# Patient Record
Sex: Male | Born: 1980
Health system: Southern US, Community
[De-identification: ages and names within clinical notes are randomized; demographics above are authoritative.]

## PROBLEM LIST (undated history)

## (undated) DIAGNOSIS — L309 Dermatitis, unspecified: Secondary | ICD-10-CM

## (undated) DIAGNOSIS — M419 Scoliosis, unspecified: Secondary | ICD-10-CM

## (undated) DIAGNOSIS — T7840XA Allergy, unspecified, initial encounter: Secondary | ICD-10-CM

## (undated) DIAGNOSIS — J309 Allergic rhinitis, unspecified: Secondary | ICD-10-CM

## (undated) DIAGNOSIS — K219 Gastro-esophageal reflux disease without esophagitis: Secondary | ICD-10-CM

## (undated) DIAGNOSIS — B079 Viral wart, unspecified: Secondary | ICD-10-CM

## (undated) HISTORY — PX: LASIK: SHX215

## (undated) HISTORY — PX: TYMPANOSTOMY TUBE PLACEMENT: SHX32

## (undated) HISTORY — DX: Gastro-esophageal reflux disease without esophagitis: K21.9

## (undated) HISTORY — DX: Viral wart, unspecified: B07.9

## (undated) HISTORY — DX: Allergy, unspecified, initial encounter: T78.40XA

## (undated) HISTORY — DX: Allergic rhinitis, unspecified: J30.9

## (undated) HISTORY — DX: Dermatitis, unspecified: L30.9

## (undated) HISTORY — DX: Scoliosis, unspecified: M41.9

---

## 2012-12-29 ENCOUNTER — Ambulatory Visit (INDEPENDENT_AMBULATORY_CARE_PROVIDER_SITE_OTHER): Payer: BC Managed Care – PPO | Admitting: Physician Assistant

## 2012-12-29 VITALS — BP 108/68 | HR 92 | Temp 97.9°F | Resp 18 | Ht 71.0 in | Wt 166.8 lb

## 2012-12-29 DIAGNOSIS — M5380 Other specified dorsopathies, site unspecified: Secondary | ICD-10-CM

## 2012-12-29 DIAGNOSIS — S335XXA Sprain of ligaments of lumbar spine, initial encounter: Secondary | ICD-10-CM

## 2012-12-29 DIAGNOSIS — S39012A Strain of muscle, fascia and tendon of lower back, initial encounter: Secondary | ICD-10-CM

## 2012-12-29 DIAGNOSIS — M62830 Muscle spasm of back: Secondary | ICD-10-CM

## 2012-12-29 MED ORDER — MELOXICAM 7.5 MG PO TABS: 15.0000 mg | ORAL_TABLET | Freq: Every day | ORAL | Status: DC

## 2012-12-29 MED ORDER — CYCLOBENZAPRINE HCL 5 MG PO TABS: 5.0000 mg | ORAL_TABLET | Freq: Three times a day (TID) | ORAL | Status: DC | PRN

## 2012-12-29 NOTE — Progress Notes (Signed)
   [address] [place] [address]   Phone [phone]  Subjective:    Patient ID: Jeffrey King, male    DOB: 1981-04-19, 32 y.o.   MRN: [id number]  HPI Pt presents to clinic with lumbar pain that started yesterday - he thinks while he was playing volleyball though he does not remember a particular incident where the injury occurred.  He only has local pain that is aggravated by movement.  He does not have pain radiation.  He has been using heat pacds but they have not been helping much. 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  He is having no bowel or bladder issues.     Review of Systems  Musculoskeletal: Positive for back pain. Negative for myalgias, arthralgias and gait problem.       Objective:   Physical Exam  Vitals reviewed. Constitutional: He appears well-developed and well-nourished.  HENT:  Head: Normocephalic and atraumatic.  Right Ear: External ear normal.  Left Ear: External ear normal.  Eyes: Conjunctivae are normal.  Neck: Normal range of motion.  Pulmonary/Chest: Effort normal.  Musculoskeletal:       Lumbar back: He exhibits decreased range of motion (patient moving stiffly - sitting on the table stiffly), tenderness and spasm (L spasm palpable). He exhibits no bony tenderness.  Skin: Skin is warm and dry.  Psychiatric: He has a normal mood and affect. His behavior is normal. Judgment and thought content normal.          Assessment & Plan:  Muscle spasm of back - ok to continue heat - stretches- Plan: meloxicam (MOBIC) 7.5 MG tablet, cyclobenzaprine (FLEXERIL) 5 MG tablet  Lumbar strain, initial encounter - Plan: meloxicam (MOBIC) 7.5 MG tablet, cyclobenzaprine (FLEXERIL) 5 MG tablet  Recheck in 7-10 d if not improving - sooner if worse  Benny Lennert PA-C 12/29/2012 3:55 PM

## 2013-01-31 ENCOUNTER — Ambulatory Visit (INDEPENDENT_AMBULATORY_CARE_PROVIDER_SITE_OTHER): Payer: BC Managed Care – PPO | Admitting: Family Medicine

## 2013-01-31 VITALS — BP 103/70 | HR 73 | Temp 98.0°F | Resp 16 | Ht 71.5 in | Wt 162.6 lb

## 2013-01-31 DIAGNOSIS — B084 Enteroviral vesicular stomatitis with exanthem: Secondary | ICD-10-CM

## 2013-01-31 DIAGNOSIS — R21 Rash and other nonspecific skin eruption: Secondary | ICD-10-CM

## 2013-01-31 DIAGNOSIS — M79609 Pain in unspecified limb: Secondary | ICD-10-CM

## 2013-01-31 LAB — POCT CBC
HCT, POC: 48.2 % (ref 43.5–53.7)
Hemoglobin: 15.2 g/dL (ref 14.1–18.1)
Lymph, poc: 1.3 (ref 0.6–3.4)
MCH, POC: 29.1 pg (ref 27–31.2)
MCHC: 31.5 g/dL — AB (ref 31.8–35.4)
WBC: 5.1 10*3/uL (ref 4.6–10.2)

## 2013-01-31 MED ORDER — HYDROCODONE-ACETAMINOPHEN 5-325 MG PO TABS: 1.0000 | ORAL_TABLET | Freq: Four times a day (QID) | ORAL | Status: DC | PRN

## 2013-01-31 MED ORDER — IBUPROFEN 800 MG PO TABS: 800.0000 mg | ORAL_TABLET | Freq: Three times a day (TID) | ORAL | Status: DC | PRN

## 2013-01-31 NOTE — Progress Notes (Signed)
[address], [place] [address]   Phone [phone]  Subjective:    Patient ID: Jeffrey King, male    DOB: October 28, 1980, 32 y.o.   MRN: [id number]  HPI Pt presents to clinic with painful soles and palms.  Started on Thursday with a slight sore throat and then a subjective fever.  Then yesterday he noticed a rash on his soles of his feet and palms of hands and then last pm they became painful to the point where he was unable to sleep last pm.  He drove here with his wrist because he could not grip the steering wheel.  He does not engage in high risk sex.  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  He has not travel recently.  He feels fine now except for the intense pain with the palms and soles of his feet and with a minor ST.  He has tried Motrin but not gotten any relief.    Review of Systems  Constitutional: Positive for fever (subjective). Negative for chills.  HENT: Positive for sore throat.   Musculoskeletal: Gait problem: 2nd to pain.  Skin: Positive for rash.       Objective:   Physical Exam  Vitals reviewed. Constitutional: He is oriented to person, place, and time. He appears well-developed and well-nourished.  HENT:  Head: Normocephalic and atraumatic.  Right Ear: Hearing, tympanic membrane, external ear and ear canal normal.  Left Ear: Hearing, tympanic membrane, external ear and ear canal normal.  Nose: Nose normal.  Mouth/Throat: Posterior oropharyngeal erythema present. No oropharyngeal exudate, posterior oropharyngeal edema or tonsillar abscesses.  Multiple blisters in his posterior pharynx and soft palate with a swollen and erythematous uvula.  No buccal or tongue lesions.  Pulmonary/Chest: Effort normal.  Neurological: He is alert and oriented to person, place, and time.  Skin: Skin is warm and dry. Rash noted. Rash is vesicular.  Vesicular rash with erythematous base on soles and palms and mouth consistent with hand foot mouth.  He has some  discrete erythematous papules on her arms and the R side of his back and posterior R thigh but these are not vesicles and they are not bothering him.  Psychiatric: He has a normal mood and affect. His behavior is normal. Judgment and thought content normal.   Results for orders placed in visit on 01/31/13  POCT CBC      Result Value Range   WBC 5.1  4.6 - 10.2 K/uL   Lymph, poc 1.3  0.6 - 3.4   POC LYMPH PERCENT 25.2  10 - 50 %L   MID (cbc) 0.4  0 - 0.9   POC MID % 7.5  0 - 12 %M   POC Granulocyte 3.4  2 - 6.9   Granulocyte percent 67.3  37 - 80 %G   RBC 5.23  4.69 - 6.13 M/uL   Hemoglobin 15.2  14.1 - 18.1 g/dL   HCT, POC 21.3  08.6 - 53.7 %   MCV 92.2  80 - 97 fL   MCH, POC 29.1  27 - 31.2 pg   MCHC 31.5 (*) 31.8 - 35.4 g/dL   RDW, POC 57.8     Platelet Count, POC 162  142 - 424 K/uL   MPV 8.6  0 - 99.8 fL          Assessment & Plan:  Rash and nonspecific skin eruption -The pain seems unusual for Hand Foot Mouth but the rash is consistent  with the infection as is the prodrome of fever and not feeling well.  Will treat the pain and wait for 3-5d for the disease to spontaneously resolve like expected with the natures course of the infection.  Will check other labs but expect them to be normal.  Plan: POCT CBC, RPR, HIV antibody  Hand, foot and mouth disease - Plan: ibuprofen (ADVIL,MOTRIN) 800 MG tablet, HYDROcodone-acetaminophen (NORCO/VICODIN) 5-325 MG per tablet  Hand and feet pain.  D/w Dr Enos Fling PA-C 01/31/2013 11:57 AM

## 2013-03-10 NOTE — Progress Notes (Signed)
History and physical exam reviewed in detail with Sarah Weber, PA-C. Agree with assessment and plan. 

## 2013-10-04 ENCOUNTER — Ambulatory Visit (INDEPENDENT_AMBULATORY_CARE_PROVIDER_SITE_OTHER): Payer: BC Managed Care – PPO | Admitting: Family Medicine

## 2013-10-04 VITALS — BP 105/75 | HR 73 | Temp 99.8°F | Resp 16 | Ht 71.5 in | Wt 157.0 lb

## 2013-10-04 DIAGNOSIS — R21 Rash and other nonspecific skin eruption: Secondary | ICD-10-CM

## 2013-10-04 DIAGNOSIS — H6980 Other specified disorders of Eustachian tube, unspecified ear: Secondary | ICD-10-CM

## 2013-10-04 LAB — POCT SKIN KOH: SKIN KOH, POC: NEGATIVE

## 2013-10-04 MED ORDER — FLUTICASONE PROPIONATE 50 MCG/ACT NA SUSP: 2.0000 | Freq: Every day | NASAL | Status: DC

## 2013-10-04 MED ORDER — TRIAMCINOLONE ACETONIDE 0.1 % EX CREA: 1.0000 "application " | TOPICAL_CREAM | Freq: Two times a day (BID) | CUTANEOUS | Status: DC

## 2013-10-04 NOTE — Patient Instructions (Signed)
I will give you a call in a little while with your skin scraping results.  Try the nasal spray once a day for about a month- if not helpful please let me know!

## 2013-10-04 NOTE — Progress Notes (Signed)
[facility] [address], [place] [id number] [phone]  Date:  10/04/2013   Name:  Jeffrey HippsDarren King   DOB:  02/19/1981   MRN:  [id number]  PCP:  No primary provider on file.    Chief Complaint: Rash   History of Present Illness:  Jeffrey King is a 33 y.o. very pleasant male patient who presents with the following:  He is here today with a rash on his neck for 4 or 5 weeks- it is not really itchy, but feels "like mild burning."  Seemed to start after a haircut.  He does not have any other rash.  The haircut was just his typical haircut- he had sx the next day.  He has tried vaseline on it so far.   He has never had this in the past.  Over the last month the rash has not really changed.    He also noted a problem with his right ear- his ear will feel congested.  This comes and goes, he has noted it since he was a child.  Only his right ear is effected.  The ear will sometimes feel sensitive.  He may notice this a couple of times a week.  So far he has tried hydrogen peroxide drops.  He sometimes has some wax.  He has never tried any nasal spray. He is not sure if he may have some allergies- he does have sneezing and runny nose a good amount of the time.     There are no active problems to display for this patient.   Past Medical History  Diagnosis Date  . Allergy     No past surgical history on file.  History  Substance Use Topics  . Smoking status: Never Smoker   . Smokeless tobacco: Not on file  . Alcohol Use: Yes     Comment: rare socailly    Family History  Problem Relation Age of Onset  . Hypertension Father     No Known Allergies  Medication list has been reviewed and updated.  Current Outpatient Prescriptions on File Prior to Visit  Medication Sig Dispense Refill  . ibuprofen (ADVIL,MOTRIN) 800 MG tablet Take 1 tablet (800 mg total) by mouth every 8 (eight) hours as needed for pain.  30 tablet  0   No current facility-administered  medications on file prior to visit.    Review of Systems:  As per HPI- otherwise negative.   Physical Examination: Filed Vitals:   10/04/13 1151  BP: 105/75  Pulse: 73  Temp: 99.8 F (37.7 C)  Resp: 16   Filed Vitals:   10/04/13 1151  Height: 5' 11.5" (1.816 m)  Weight: 157 lb (71.215 kg)   Body mass index is 21.59 kg/(m^2). Ideal Body Weight: Weight in (lb) to have BMI = 25: 181.4  GEN: WDWN, NAD, Non-toxic, A & O x 3, looks well HEENT: Atraumatic, Normocephalic. Neck supple. No masses, No LAD.  Bilateral TM wnl, oropharynx normal.  PEERL,EOMI.   Nasal cavity congested Ears and Nose: No external deformity. CV: RRR, No M/G/R. No JVD. No thrill. No extra heart sounds. PULM: CTA B, no wheezes, crackles, rhonchi. No retractions. No resp. distress. No accessory muscle use. EXTR: No c/c/e NEURO Normal gait.  PSYCH: Normally interactive. Conversant. Not depressed or anxious appearing.  Calm demeanor.  He has a scaly, patchy rash on the right posterior neck.  No other rash, no lesions or vesicles  Results for orders placed in visit on  10/04/13  POCT SKIN KOH      Result Value Ref Range   Skin KOH, POC Negative      Assessment and Plan: Rash and nonspecific skin eruption - Plan: POCT Skin KOH, triamcinolone cream (KENALOG) 0.1 %  ETD (eustachian tube dysfunction) - Plan: fluticasone (FLONASE) 50 MCG/ACT nasal spray  Will try triamcinolone for likely eczema type rash; he can use BID, if not better in 7- 10 days he is to give me a call Trial of flonase for periodic ETD of the right ear; if not helpful will refer t ENT  Signed Abbe Amsterdam, MD

## 2014-01-20 ENCOUNTER — Ambulatory Visit (INDEPENDENT_AMBULATORY_CARE_PROVIDER_SITE_OTHER): Payer: BC Managed Care – PPO | Admitting: Family Medicine

## 2014-01-20 VITALS — BP 108/82 | HR 85 | Temp 98.3°F | Resp 16 | Ht 71.5 in | Wt 162.4 lb

## 2014-01-20 DIAGNOSIS — M62838 Other muscle spasm: Secondary | ICD-10-CM

## 2014-01-20 MED ORDER — CYCLOBENZAPRINE HCL 10 MG PO TABS: 10.0000 mg | ORAL_TABLET | Freq: Three times a day (TID) | ORAL | Status: DC | PRN

## 2014-01-20 MED ORDER — FLUTICASONE PROPIONATE 50 MCG/ACT NA SUSP: 2.0000 | Freq: Every day | NASAL | Status: DC

## 2014-01-20 MED ORDER — TRAMADOL HCL 50 MG PO TABS: 50.0000 mg | ORAL_TABLET | Freq: Three times a day (TID) | ORAL | Status: DC | PRN

## 2014-01-20 NOTE — Patient Instructions (Signed)
Thank you for coming in today  You have neck spasms. 1. Take cyclobenzaprine for spasm up to 3x per day. Will make you drowsy. Do not drive on it. 2. Take ibuprofen or aleve as needed for pain 3. Use tramadol as needed for pain 4. Apply heating pack to neck for 15 min every 3 hrs and work on neck motion 5. Try to stretch neck and work on motion 6. Out of work until Monday 7. If not much better on Monday, please return 8. Return sooner if any worsening pain, numbness, weakness  Torticollis, Acute You have suddenly (acutely) developed a twisted neck (torticollis). This is usually a self-limited condition. CAUSES  Acute torticollis may be caused by malposition, trauma or infection. Most commonly, acute torticollis is caused by sleeping in an awkward position. Torticollis may also be caused by the flexion, extension or twisting of the neck muscles beyond their normal position. Sometimes, the exact cause may not be known. SYMPTOMS  Usually, there is pain and limited movement of the neck. Your neck may twist to one side. DIAGNOSIS  The diagnosis is often made by physical examination. X-rays, CT scans or MRIs may be done if there is a history of trauma or concern of infection. TREATMENT  For a common, stiff neck that develops during sleep, treatment is focused on relaxing the contracted neck muscle. Medications (including shots) may be used to treat the problem. Most cases resolve in several days. Torticollis usually responds to conservative physical therapy. If left untreated, the shortened and spastic neck muscle can cause deformities in the face and neck. Rarely, surgery is required. HOME CARE INSTRUCTIONS   Use over-the-counter and prescription medications as directed by your caregiver.  Do stretching exercises and massage the neck as directed by your caregiver.  Follow up with physical therapy if needed and as directed by your caregiver. SEEK IMMEDIATE MEDICAL CARE IF:   You develop  difficulty breathing or noisy breathing (stridor).  You drool, develop trouble swallowing or have pain with swallowing.  You develop numbness or weakness in the hands or feet.  You have changes in speech or vision.  You have problems with urination or bowel movements.  You have difficulty walking.  You have a fever.  You have increased pain. MAKE SURE YOU:   Understand these instructions.  Will watch your condition.  Will get help right away if you are not doing well or get worse. Document Released: 07/19/2000 Document Revised: 10/14/2011 Document Reviewed: 08/30/2009 [facility] Patient Information 2015 [employer] [place]. This information is not intended to replace advice given to you by your health care provider. Make sure you discuss any questions you have with your health care provider.

## 2014-01-20 NOTE — Progress Notes (Signed)
   Subjective:    Patient ID: Jeffrey King, male    DOB: 08/07/1980, 33 y.o.   MRN: [id number]  HPI Patient presents with neck and upper back pain. Patient thinks he pulled something when he was putting pants on this morning. Pain radiates from neck all the down his back. Cannot move neck, very stiff. Pain to move neck. Pain even laying in bed. Tried icy hot patch and ibuprofen. Has not improved since this morning. No trauma. No pain radiating down arms. Has some numbness at the base of his neck but no numbness in hands. Had a crick in neck last year that was similar but less severe that resolved in 2 weeks. Has history of minor scoliosis. No fever. Not feeling ill.  Review of Systems  All other systems reviewed and are negative.      Objective:   Physical Exam  Constitutional: He is oriented to person, place, and time. He appears well-developed and well-nourished. No distress.  HENT:  Head: Normocephalic and atraumatic.  Eyes: Conjunctivae are normal. No scleral icterus.  Cardiovascular: Normal rate.   Pulmonary/Chest: Effort normal.  Neurological: He is alert and oriented to person, place, and time.  Skin: Skin is warm and dry.  Psychiatric: He has a normal mood and affect. His behavior is normal.  Neck: No midline TTP. ROM very decreased. Flexion to 5 fingers chin to chest. Extension to 15 deg, rotation to 30 deg each direction. TTP and palpable spasm over trap, rhomboids.    Assessment & Plan:  #1. Neck spasm - Flexeril, NSAIDs, tramadol - Heat - Work on ROM - No trauma so will defer xray at this time - Low suspicion for infection or illness given no systemic sx - Return Monday if not returning and consider xrays and PT vs. Other etiologies of pain. - Return precautions for worsening neurologic symptoms.

## 2014-01-24 ENCOUNTER — Ambulatory Visit: Payer: BC Managed Care – PPO | Admitting: Family Medicine

## 2014-01-31 ENCOUNTER — Ambulatory Visit (INDEPENDENT_AMBULATORY_CARE_PROVIDER_SITE_OTHER): Payer: BC Managed Care – PPO | Admitting: Physician Assistant

## 2014-01-31 VITALS — BP 118/62 | HR 73 | Temp 97.6°F | Resp 16 | Ht 71.0 in | Wt 159.0 lb

## 2014-01-31 DIAGNOSIS — T63481A Toxic effect of venom of other arthropod, accidental (unintentional), initial encounter: Secondary | ICD-10-CM

## 2014-01-31 DIAGNOSIS — J309 Allergic rhinitis, unspecified: Secondary | ICD-10-CM | POA: Insufficient documentation

## 2014-01-31 DIAGNOSIS — T6391XA Toxic effect of contact with unspecified venomous animal, accidental (unintentional), initial encounter: Secondary | ICD-10-CM

## 2014-01-31 DIAGNOSIS — J301 Allergic rhinitis due to pollen: Secondary | ICD-10-CM

## 2014-01-31 MED ORDER — PREDNISONE 20 MG PO TABS: ORAL_TABLET | ORAL | Status: DC

## 2014-01-31 NOTE — Patient Instructions (Signed)
Continue the Flonase. The prednisone will help that temporarily, too. Once you complete the prednisone, consider adding a daily dose of Allegra, Claritin or Zyrtec if your allergy symptoms are not adequately controlled by the Flonase alone.

## 2014-01-31 NOTE — Progress Notes (Signed)
   Subjective:    Patient ID: Jeffrey King, male    DOB: 12/17/1980, 33 y.o.   MRN: [id number]   PCP: No PCP Per Patient  Chief Complaint  Patient presents with  . Insect Bite    stung yesterday, right index finger is swollen and stiff, painful    Medications, allergies, past medical history, surgical history, family history, social history and problem list reviewed and updated.  Patient Active Problem List   Diagnosis Date Noted  . Allergic rhinitis 01/31/2014    Prior to Admission medications   Medication Sig Start Date End Date Taking? Authorizing Provider  fluticasone (FLONASE) 50 MCG/ACT nasal spray Place 2 sprays into both nostrils daily. 01/20/14  Yes Daine GipErin L Voss, MD  triamcinolone cream (KENALOG) 0.1 % Apply 1 application topically 2 (two) times daily. 10/04/13   Pearline CablesJessica C Copland, MD    HPI  Presents for evaluation of swelling of the RIGHT index finger after sting by a yellow jacket yesterday afternoon while mowing the yard.  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 He took a benadryl yesterday which "knocked me out." He notes the swelling is worse this morning, and he has pain with bending the index finger.  No other lesions.  No mouth, tongue or throat symptoms. No pulmonary symptoms.  Review of Systems As above.    Objective:   Physical Exam  Constitutional: He is oriented to person, place, and time. He appears well-developed and well-nourished. No distress.  BP 118/62  Pulse 73  Temp(Src) 97.6 F (36.4 C) (Oral)  Resp 16  Ht 5\' 11"  (1.803 m)  Wt 159 lb (72.122 kg)  BMI 22.19 kg/m2  SpO2 99%   Eyes: Conjunctivae are normal. No scleral icterus.  Neck: Neck supple. No thyromegaly present.  Cardiovascular: Normal rate.   Pulmonary/Chest: Effort normal.  Lymphadenopathy:    He has no cervical adenopathy.  Neurological: He is alert and oriented to person, place, and time.  Skin: Skin is warm, dry and intact.  No abrasion, no burn, no lesion and no rash noted. There is erythema.  Erythema of the index finger, primarily of the palmar surface. Stable joints.  ROM reduced by pain and swelling.  Psychiatric: He has a normal mood and affect. His behavior is normal. Thought content normal.          Assessment & Plan:  1. Insect sting, accidental or unintentional, initial encounter He has triamcinolone cream at home from a previous visit. OTC antihistamine prn. Anticipatory guidance provided. - predniSONE (DELTASONE) 20 MG tablet; Take 3 PO QAM x3days, 2 PO QAM x3days, 1 PO QAM x3days  Dispense: 18 tablet; Refill: 0  2. Allergic rhinitis due to pollen Encouraged to continue Flonase.  The prednisone above will also help the ETD symptoms he has.  If symptoms persist, RTC to discuss additional therapy.   Fernande Brashelle S. Jeffery, PA-C Physician Assistant-Certified [facility]

## 2014-03-16 ENCOUNTER — Encounter: Payer: BC Managed Care – PPO | Admitting: Family Medicine

## 2014-03-23 ENCOUNTER — Other Ambulatory Visit: Payer: Self-pay | Admitting: Internal Medicine

## 2014-03-23 DIAGNOSIS — J329 Chronic sinusitis, unspecified: Secondary | ICD-10-CM

## 2014-03-23 DIAGNOSIS — M419 Scoliosis, unspecified: Secondary | ICD-10-CM

## 2014-03-29 ENCOUNTER — Ambulatory Visit
Admission: RE | Admit: 2014-03-29 | Discharge: 2014-03-29 | Disposition: A | Discharge status: Home / Self Care | Payer: BC Managed Care – PPO | Source: Ambulatory Visit | Attending: Internal Medicine | Admitting: Internal Medicine

## 2014-03-29 DIAGNOSIS — M419 Scoliosis, unspecified: Secondary | ICD-10-CM

## 2014-03-29 DIAGNOSIS — J329 Chronic sinusitis, unspecified: Secondary | ICD-10-CM

## 2015-01-20 IMAGING — CT CT MAXILLOFACIAL W/O CM
2 of 3 series · 15 of 37 positions shown, 18 images · non-contrast
Comparison: None.

CLINICAL DATA: RIGHT ear pain  and eye pain.  Headaches.

EXAM:
CT MAXILLOFACIAL WITHOUT CONTRAST
TECHNIQUE: Multidetector CT imaging of the maxillofacial structures was
performed. Multiplanar CT image reconstructions were also generated.
A small metallic BB was placed on the right temple in order to
reliably differentiate right from left.

[Series 601: coronal facial · coronal · 0.37mm/px · 3 of 82 slices shown]
[im 28/82  bone]
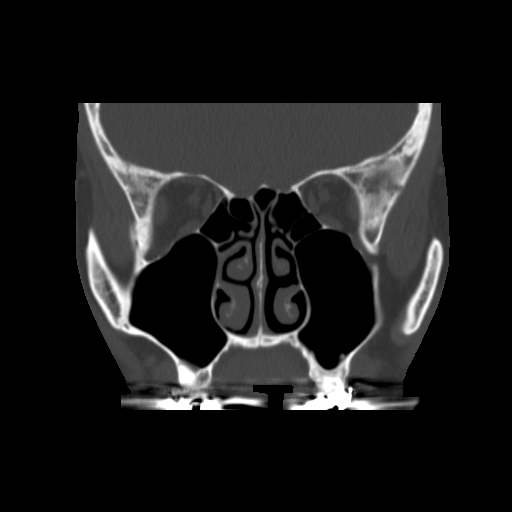
[im 41/82  bone]
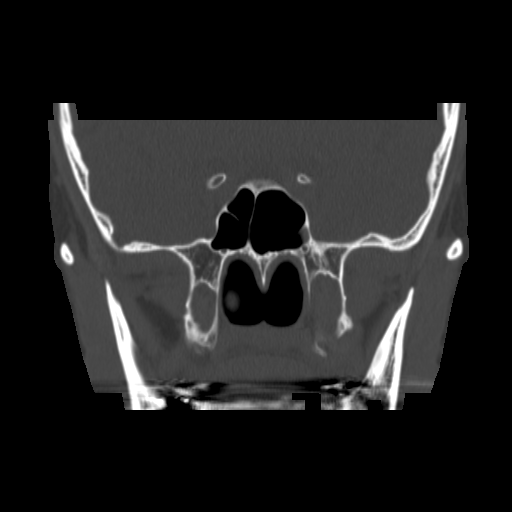
[im 55/82  bone]
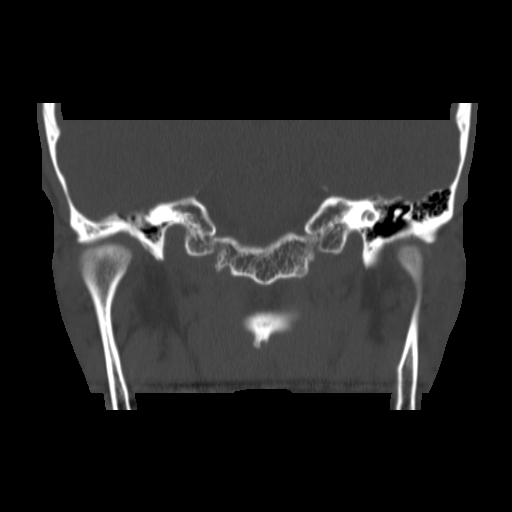

[Series 602: sagittal facial · sagittal · 0.37mm/px · 12 of 95 slices shown, 15 images]
[im 10/95  brain]
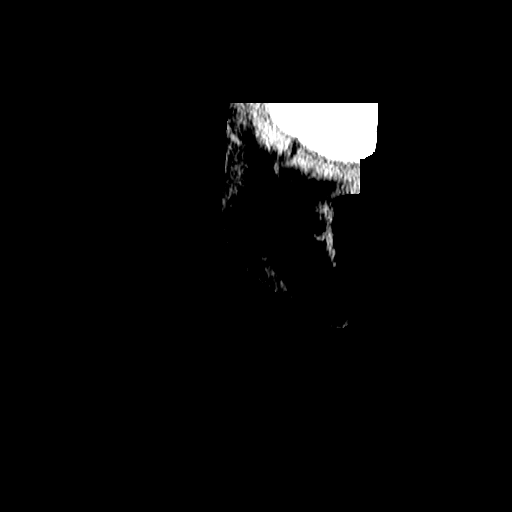
[im 10/95  bone]
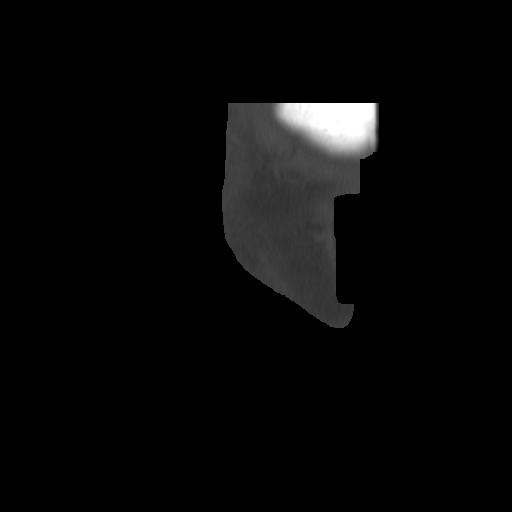
[im 20/95  bone]
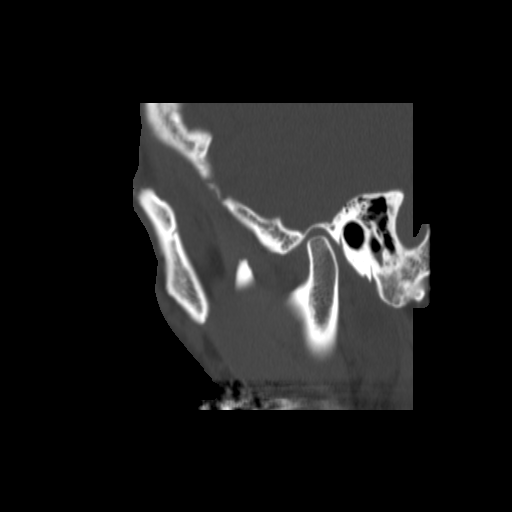
[im 25/95  bone]
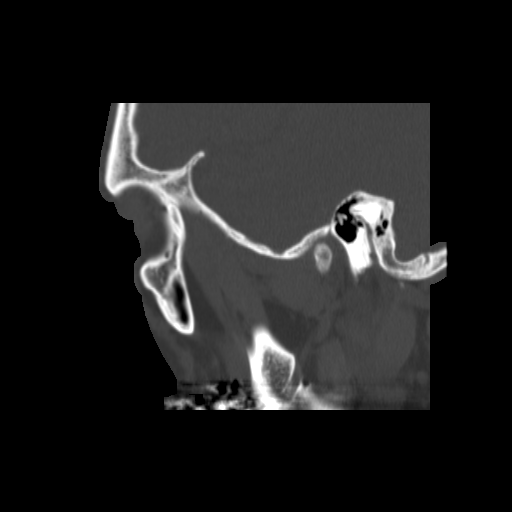
[im 30/95  bone]
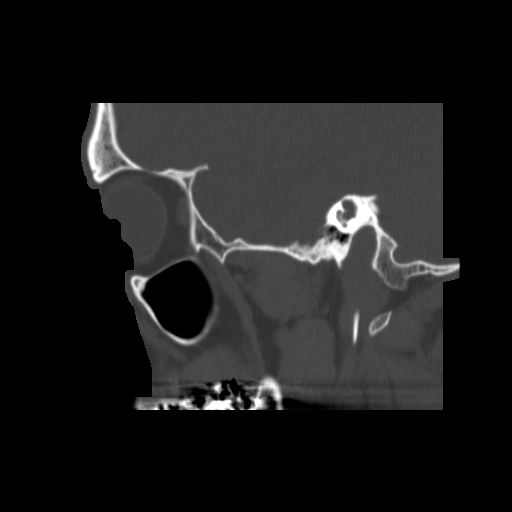
[im 40/95  brain]
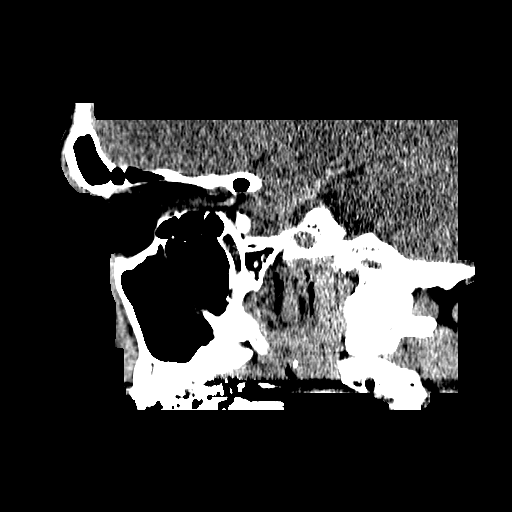
[im 40/95  bone]
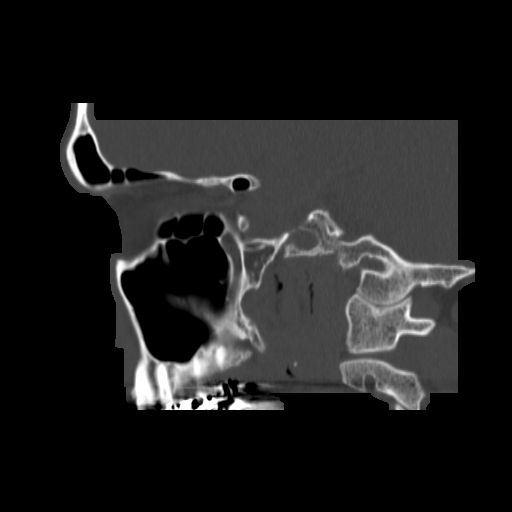
[im 45/95  bone]
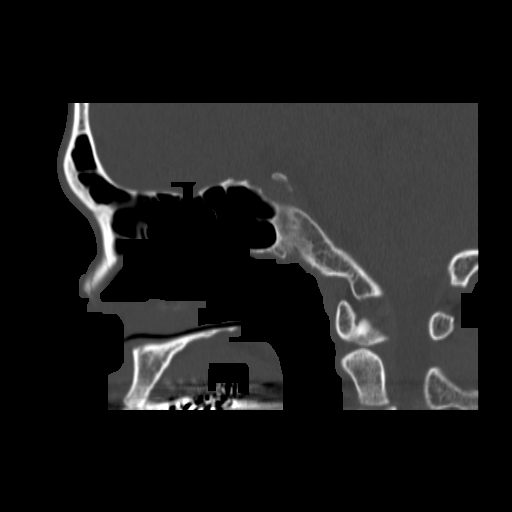
[im 55/95  bone]
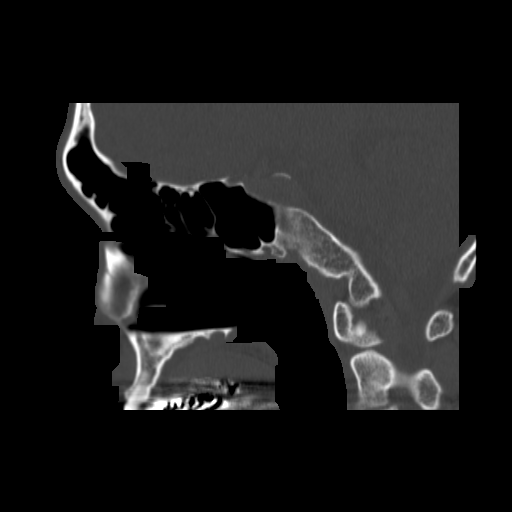
[im 60/95  bone]
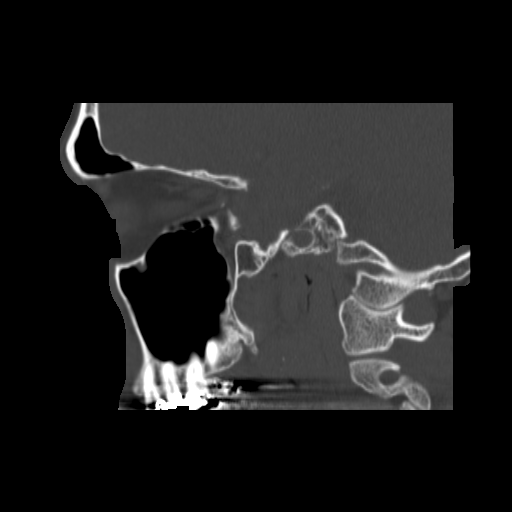
[im 70/95  brain]
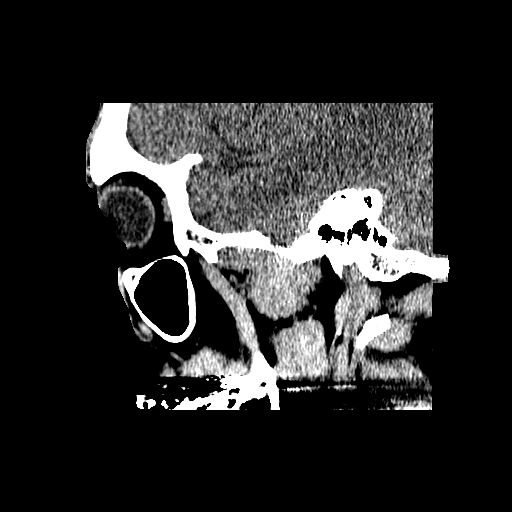
[im 70/95  bone]
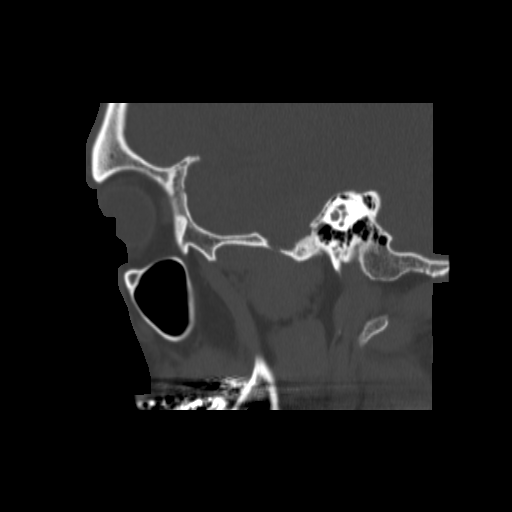
[im 75/95  bone]
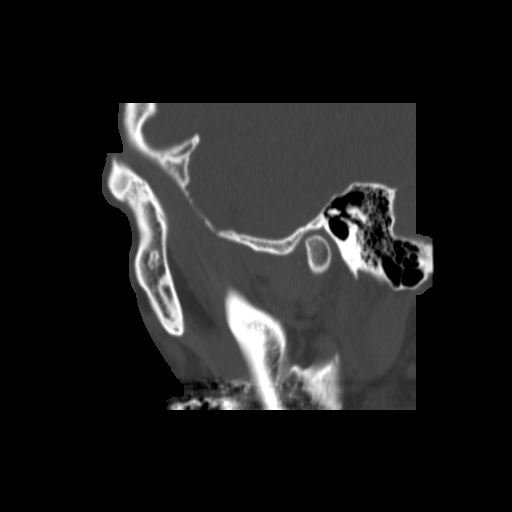
[im 80/95  bone]
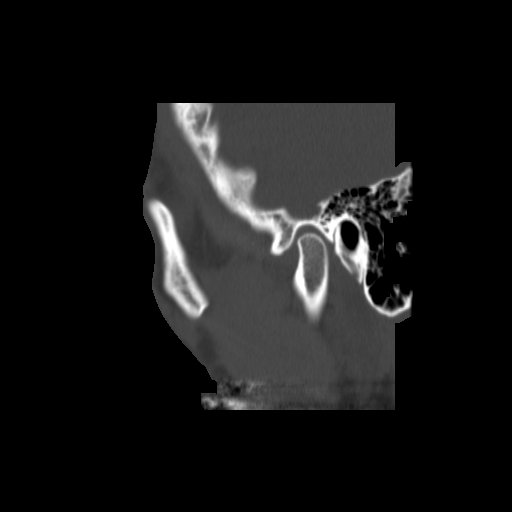
[im 90/95  bone]
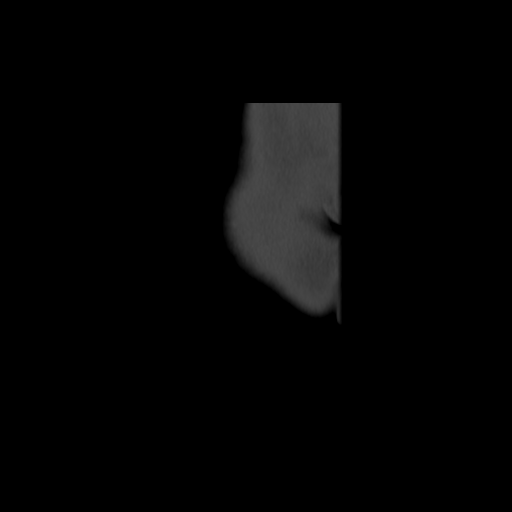

[15 of 37 positions shown; findings below may reference images not displayed]

FINDINGS: The temporal bones are not evaluated as part of the CT maxillofacial
study. If there is concern for temporal bone pathology, CT temporal
bone is needed.

The maxillary sinuses are clear. The ethmoid sinuses are clear. The
sphenoid sinus is clear. The frontal sinuses are clear. There is no
orbital inflammation or mass. Negative intracranial compartment.
Slight nasal septal deviation RIGHT to LEFT of 1-2 mm. TMJs are
located. There is no facial bone deformity or fracture. Visualized
intracranial compartment unremarkable.

In those portions of the temporal bone which are visualized, there
is no middle ear or mastoid fluid observed. External canals appear
patent. No superficial cellulitic change is observed.
IMPRESSION: Unremarkable CT maxillofacial.

## 2016-01-19 ENCOUNTER — Encounter: Payer: Self-pay | Admitting: Family Medicine

## 2016-01-19 ENCOUNTER — Ambulatory Visit (INDEPENDENT_AMBULATORY_CARE_PROVIDER_SITE_OTHER): Payer: BLUE CROSS/BLUE SHIELD | Admitting: Family Medicine

## 2016-01-19 VITALS — BP 102/62 | HR 73 | Temp 98.1°F | Resp 17 | Ht 71.5 in | Wt 163.0 lb

## 2016-01-19 DIAGNOSIS — R21 Rash and other nonspecific skin eruption: Secondary | ICD-10-CM

## 2016-01-19 DIAGNOSIS — L282 Other prurigo: Secondary | ICD-10-CM | POA: Diagnosis not present

## 2016-01-19 MED ORDER — PERMETHRIN 5 % EX CREA: 1.0000 "application " | TOPICAL_CREAM | Freq: Once | CUTANEOUS | Status: DC

## 2016-01-19 MED ORDER — TRIAMCINOLONE ACETONIDE 0.1 % EX CREA: 1.0000 "application " | TOPICAL_CREAM | Freq: Two times a day (BID) | CUTANEOUS | Status: DC

## 2016-01-19 NOTE — Progress Notes (Signed)
By signing my name below I, Shelah Lewandowsky, attest that this documentation has been prepared under the direction and in the presence of Shade Flood, MD. Electonically Signed. Shelah Lewandowsky, Scribe 01/19/2016 at 11:24 AM  Subjective:    Patient ID: Jeffrey King, male    DOB: [birth year], 35 y.o.   MRN: [id number]  Chief Complaint  Patient presents with  . Rash    on legs     HPI Jeffrey King is a 35 y.o. male who presents to the [facility] complaining of bilat leg rash for the past 2 weeks. Rash has been spreading steadily down legs over the past 2 weeks and to arms 2 days ago. Rash is mostly pruritic. Pt has been trying anti-itch cream and hydrocortisone cream with minimal relief.  Pt has been waking up itching the rash. Pt denies fever. Pt denies any known contacts with similar symptoms. Pt does not live with pets. Pt has been crawling in the crawlspace under his house for past 4 weeks. Pt denies rash on genitals, or in mouth. Pt denies difficulty breathing. Pt denies any new supplements, medications, or foods.   Pt's 2 children and wife who live with pt do not have any similar symptoms.     Patient Active Problem List   Diagnosis Date Noted  . Allergic rhinitis 01/31/2014   Past Medical History  Diagnosis Date  . Allergy   . Scoliosis     mild   No past surgical history on file. No Known Allergies Prior to Admission medications   Medication Sig Start Date End Date Taking? Authorizing Provider  fluticasone (FLONASE) 50 MCG/ACT nasal spray Place 2 sprays into both nostrils daily. 01/20/14  Yes Daine Gip, MD  predniSONE (DELTASONE) 20 MG tablet Take 3 PO QAM x3days, 2 PO QAM x3days, 1 PO QAM x3days Patient not taking: Reported on 01/19/2016 01/31/14   Porfirio Oar, PA-C  triamcinolone cream (KENALOG) 0.1 % Apply 1 application topically 2 (two) times daily. Patient not taking: Reported on 01/19/2016 10/04/13   Pearline Cables, MD   Social History   Social  History  . Marital Status: Single    Spouse Name: n/a  . Number of Children: 3  . Years of Education: 14   Occupational History  . nail tech     [employer]   Social History Main Topics  . Smoking status: Former Games developer  . Smokeless tobacco: Never Used  . Alcohol Use: Yes     Comment: rare socially  . Drug Use: No  . Sexual Activity: Not on file   Other Topics Concern  . Not on file   Social History Narrative   Lives with his 3 children. His family lives in [place].      Review of Systems  Constitutional: Negative for fever.  Skin: Positive for rash (bilat legs).  Psychiatric/Behavioral: Positive for sleep disturbance.       Objective:   Physical Exam  Constitutional: He is oriented to person, place, and time. He appears well-developed and well-nourished. No distress.  HENT:  Head: Normocephalic and atraumatic.  Mouth/Throat: Oropharynx is clear and moist and mucous membranes are normal. No oral lesions. No oropharyngeal exudate or posterior oropharyngeal erythema.  Eyes: Conjunctivae are normal. Pupils are equal, round, and reactive to light.  Neck: Neck supple.  Cardiovascular: Normal rate, regular rhythm and normal heart sounds.  Exam reveals no gallop and no friction rub.   No murmur heard. Pulmonary/Chest: Effort normal  and breath sounds normal. No accessory muscle usage. He has no decreased breath sounds. He has no wheezes. He has no rales.  Musculoskeletal: Normal range of motion.  Neurological: He is alert and oriented to person, place, and time.  Skin: Skin is warm and dry.  Pt has multiple scattered papules and excoriated areas on the lower extremities L>R. Minimal erythema. There are a few new papules on the dorsum left foot and rt foot (L>R) and left ankle and the posterior calf and knee. Pt also has one linear rash on left upper arm, few small scattered papules on left wrist. No interdigital lesions.  There are also 2 small papules on rt arm.    Psychiatric: He has a normal mood and affect. His behavior is normal.  Nursing note and vitals reviewed.    Filed Vitals:   01/19/16 1055  BP: 102/62  Pulse: 73  Temp: 98.1 F (36.7 C)  TempSrc: Oral  Resp: 17  Height: 5' 11.5" (1.816 m)  Weight: 163 lb (73.936 kg)  SpO2: 100%        Assessment & Plan:   Jeffrey King is a 35 y.o. male Rash and nonspecific skin eruption - Plan: permethrin (ELIMITE) 5 % cream, triamcinolone cream (KENALOG) 0.1 %  Pruritic rash - Plan: permethrin (ELIMITE) 5 % cream, triamcinolone cream (KENALOG) 0.1 %  Persistent rash for 2 weeks, initially on lower extremities, but now on upper extremities/arms, one area of possible burrow on left arm. Spares genitalia and mouth.  Differential includes scabies, versus other insect bite versus contact dermatitis.  - We'll try Elimite cream tonight, then triamcinolone topical if needed for residual itching areas. If it continues to spread, return for recheck, or if not improving after 1 week, return for recheck. RTC precautions.  Meds ordered this encounter  Medications  . permethrin (ELIMITE) 5 % cream    Sig: Apply 1 application topically once.    Dispense:  60 g    Refill:  0  . triamcinolone cream (KENALOG) 0.1 %    Sig: Apply 1 application topically 2 (two) times daily.    Dispense:  30 g    Refill:  0   Patient Instructions       IF you received an x-ray today, you will receive an invoice from [facility]. Please contact [facility] at [phone] with questions or concerns regarding your invoice.   IF you received labwork today, you will receive an invoice from [employer]. Please contact [facility] at [phone] with questions or concerns regarding your invoice.   Our billing staff will not be able to assist you with questions regarding bills from these companies.  You will be contacted with the lab results as soon as they are available. The  fastest way to get your results is to activate your My Chart account. Instructions are located on the last page of this paperwork. If you have not heard from [place] regarding the results in 2 weeks, please contact this office.     Your rash including area spreading to arms is suspicious for possible scabies. It can also be due to other insect bites, or a contact dermatitis. Try the Elimite cream once tonight. Rinses after 8 hours, but as we discussed make sure you wash bed clothes in hot water prior to showering off in the morning. If you still have some areas of itching, you can use the steroid cream that I prescribed. If symptoms are not improving into next Friday, or  worsening sooner, return for recheck.  Scabies, Adult Scabies is a skin condition that happens when very small insects get under the skin (infestation). This causes a rash and severe itchiness. Scabies can spread from person to person (is contagious). If you get scabies, it is common for others in your household to get scabies too. With proper treatment, symptoms usually go away in 2-4 weeks. Scabies usually does not cause lasting problems. CAUSES This condition is caused by mites (Sarcoptes scabiei, or human itch mites) that can only be seen with a microscope. The mites get into the top layer of skin and lay eggs. Scabies can spread from person to person through:  Close contact with a person who has scabies.  Contact with infested items, such as towels, bedding, or clothing. RISK FACTORS This condition is more likely to develop in:  People who live in nursing homes and other extended-care facilities.  People who have sexual contact with a partner who has scabies.  Young children who attend child care facilities.  People who care for others who are at increased risk for scabies. SYMPTOMS Symptoms of this condition may include:  Severe itchiness. This is often worse at night.  A rash that includes tiny red bumps or blisters.  The rash commonly occurs on the wrist, elbow, armpit, fingers, waist, groin, or buttocks. Bumps may form a line (burrow) in some areas.  Skin irritation. This can include scaly patches or sores. DIAGNOSIS This condition is diagnosed with a physical exam. Your health care provider will look closely at your skin. In some cases, your health care provider may take a sample of your affected skin (skin scraping) and have it examined under a microscope. TREATMENT This condition may be treated with:  Medicated cream or lotion that kills the mites. This is spread on the entire body and left on for several hours. Usually, one treatment with medicated cream or lotion is enough to kill all of the mites. In severe cases, the treatment may be repeated.  Medicated cream that relieves itching.  Medicines that help to relieve itching.  Medicines that kill the mites. This treatment is rarely used. HOME CARE INSTRUCTIONS Medicines  Take or apply over-the-counter and prescription medicines as told by your health care provider.  Apply medicated cream or lotion as told by your health care provider.  Do not wash off the medicated cream or lotion until the necessary amount of time has passed. Skin Care  Avoid scratching your affected skin.  Keep your fingernails closely trimmed to reduce injury from scratching.  Take cool baths or apply cool washcloths to help reduce itching. General Instructions  Clean all items that you recently had contact with, including bedding, clothing, and furniture. Do this on the same day that your treatment starts.  Use hot water when you wash items.  Place unwashable items into closed, airtight plastic bags for at least 3 days. The mites cannot live for more than 3 days away from human skin.  Vacuum furniture and mattresses that you use.  Make sure that other people who may have been infested are examined by a health care provider. These include members of your household  and anyone who may have had contact with infested items.  Keep all follow-up visits as told by your health care provider. This is important. SEEK MEDICAL CARE IF:  You have itching that does not go away after 4 weeks of treatment.  You continue to develop new bumps or burrows.  You have redness,  swelling, or pain in your rash area after treatment.  You have fluid, blood, or pus coming from your rash.   This information is not intended to replace advice given to you by your health care provider. Make sure you discuss any questions you have with your health care provider.   Document Released: 04/12/2015 Document Reviewed: 02/21/2015 Elsevier Interactive Patient Education 2016 Elsevier Inc.  Pruritus Pruritus is an itching feeling. There are many different conditions and factors that can make your skin itchy. Dry skin is one of the most common causes of itching. Most cases of itching do not require medical attention. Itchy skin can turn into a rash.  HOME CARE INSTRUCTIONS  Watch your pruritus for any changes. Take these steps to help with your condition:  Skin Care  Moisturize your skin as needed. A moisturizer that contains petroleum jelly is best for keeping moisture in your skin.  Take or apply medicines only as directed by your health care provider. This may include:  Corticosteroid cream.  Anti-itch lotions.  Oral anti-histamines.  Apply cool compresses to the affected areas.  Try taking a bath with:  Epsom salts. Follow the instructions on the packaging. You can get these at your local pharmacy or grocery store.  Baking soda. Pour a small amount into the bath as directed by your health care provider.  Colloidal oatmeal. Follow the instructions on the packaging. You can get this at your local pharmacy or grocery store.  Try applying baking soda paste to your skin. Stir water into baking soda until it reaches a paste-like consistency.   Do not scratch your  skin.  Avoid hot showers or baths, which can make itching worse. A cold shower may help with itching as long as you use a moisturizer after.  Avoid scented soaps, detergents, and perfumes. Use gentle soaps, detergents, perfumes, and other cosmetic products. General Instructions  Avoid wearing tight clothes.  Keep a journal to help track what causes your itch. Write down:  What you eat.  What cosmetic products you use.  What you drink.  What you wear. This includes jewelry.  Use a humidifier. This keeps the air moist, which helps to prevent dry skin. SEEK MEDICAL CARE IF:  The itching does not go away after several days.  You sweat at night.  You have weight loss.  You are unusually thirsty.  You urinate more than normal.  You are more tired than normal.  You have abdominal pain.  Your skin tingles.  You feel weak.  Your skin or the whites of your eyes look yellow (jaundice).  Your skin feels numb.   This information is not intended to replace advice given to you by your health care provider. Make sure you discuss any questions you have with your health care provider.   Document Released: 04/03/2011 Document Revised: 12/06/2014 Document Reviewed: 07/18/2014 Elsevier Interactive Patient Education [employer]2016 Elsevier Inc.     I personally performed the services described in this documentation, which was scribed in my presence. The recorded information has been reviewed and considered, and addended by me as needed.   Signed,   Meredith StaggersJeffrey Ruhaan Nordahl, MD [facility].  01/19/2016 11:29 AM

## 2016-01-19 NOTE — Patient Instructions (Addendum)
IF you received an x-ray today, you will receive an invoice from [facility]. Please contact [facility] at [phone] with questions or concerns regarding your invoice.   IF you received labwork today, you will receive an invoice from [employer]. Please contact [facility] at [phone] with questions or concerns regarding your invoice.   Our billing staff will not be able to assist you with questions regarding bills from these companies.  You will be contacted with the lab results as soon as they are available. The fastest way to get your results is to activate your My Chart account. Instructions are located on the last page of this paperwork. If you have not heard from [place] regarding the results in 2 weeks, please contact this office.     Your rash including area spreading to arms is suspicious for possible scabies. It can also be due to other insect bites, or a contact dermatitis. Try the Elimite cream once tonight. Rinses after 8 hours, but as we discussed make sure you wash bed clothes in hot water prior to showering off in the morning. If you still have some areas of itching, you can use the steroid cream that I prescribed. If symptoms are not improving into next Friday, or worsening sooner, return for recheck.  Scabies, Adult Scabies is a skin condition that happens when very small insects get under the skin (infestation). This causes a rash and severe itchiness. Scabies can spread from person to person (is contagious). If you get scabies, it is common for others in your household to get scabies too. With proper treatment, symptoms usually go away in 2-4 weeks. Scabies usually does not cause lasting problems. CAUSES This condition is caused by mites (Sarcoptes scabiei, or human itch mites) that can only be seen with a microscope. The mites get into the top layer of skin and lay eggs. Scabies can spread from person to person through:  Close  contact with a person who has scabies.  Contact with infested items, such as towels, bedding, or clothing. RISK FACTORS This condition is more likely to develop in:  People who live in nursing homes and other extended-care facilities.  People who have sexual contact with a partner who has scabies.  Young children who attend child care facilities.  People who care for others who are at increased risk for scabies. SYMPTOMS Symptoms of this condition may include:  Severe itchiness. This is often worse at night.  A rash that includes tiny red bumps or blisters. The rash commonly occurs on the wrist, elbow, armpit, fingers, waist, groin, or buttocks. Bumps may form a line (burrow) in some areas.  Skin irritation. This can include scaly patches or sores. DIAGNOSIS This condition is diagnosed with a physical exam. Your health care provider will look closely at your skin. In some cases, your health care provider may take a sample of your affected skin (skin scraping) and have it examined under a microscope. TREATMENT This condition may be treated with:  Medicated cream or lotion that kills the mites. This is spread on the entire body and left on for several hours. Usually, one treatment with medicated cream or lotion is enough to kill all of the mites. In severe cases, the treatment may be repeated.  Medicated cream that relieves itching.  Medicines that help to relieve itching.  Medicines that kill the mites. This treatment is rarely used. HOME CARE INSTRUCTIONS Medicines  Take or apply over-the-counter and prescription medicines as  told by your health care provider.  Apply medicated cream or lotion as told by your health care provider.  Do not wash off the medicated cream or lotion until the necessary amount of time has passed. Skin Care  Avoid scratching your affected skin.  Keep your fingernails closely trimmed to reduce injury from scratching.  Take cool baths or apply  cool washcloths to help reduce itching. General Instructions  Clean all items that you recently had contact with, including bedding, clothing, and furniture. Do this on the same day that your treatment starts.  Use hot water when you wash items.  Place unwashable items into closed, airtight plastic bags for at least 3 days. The mites cannot live for more than 3 days away from human skin.  Vacuum furniture and mattresses that you use.  Make sure that other people who may have been infested are examined by a health care provider. These include members of your household and anyone who may have had contact with infested items.  Keep all follow-up visits as told by your health care provider. This is important. SEEK MEDICAL CARE IF:  You have itching that does not go away after 4 weeks of treatment.  You continue to develop new bumps or burrows.  You have redness, swelling, or pain in your rash area after treatment.  You have fluid, blood, or pus coming from your rash.   This information is not intended to replace advice given to you by your health care provider. Make sure you discuss any questions you have with your health care provider.   Document Released: 04/12/2015 Document Reviewed: 02/21/2015 Elsevier Interactive Patient Education 2016 Elsevier Inc.  Pruritus Pruritus is an itching feeling. There are many different conditions and factors that can make your skin itchy. Dry skin is one of the most common causes of itching. Most cases of itching do not require medical attention. Itchy skin can turn into a rash.  HOME CARE INSTRUCTIONS  Watch your pruritus for any changes. Take these steps to help with your condition:  Skin Care  Moisturize your skin as needed. A moisturizer that contains petroleum jelly is best for keeping moisture in your skin.  Take or apply medicines only as directed by your health care provider. This may include:  Corticosteroid cream.  Anti-itch  lotions.  Oral anti-histamines.  Apply cool compresses to the affected areas.  Try taking a bath with:  Epsom salts. Follow the instructions on the packaging. You can get these at your local pharmacy or grocery store.  Baking soda. Pour a small amount into the bath as directed by your health care provider.  Colloidal oatmeal. Follow the instructions on the packaging. You can get this at your local pharmacy or grocery store.  Try applying baking soda paste to your skin. Stir water into baking soda until it reaches a paste-like consistency.   Do not scratch your skin.  Avoid hot showers or baths, which can make itching worse. A cold shower may help with itching as long as you use a moisturizer after.  Avoid scented soaps, detergents, and perfumes. Use gentle soaps, detergents, perfumes, and other cosmetic products. General Instructions  Avoid wearing tight clothes.  Keep a journal to help track what causes your itch. Write down:  What you eat.  What cosmetic products you use.  What you drink.  What you wear. This includes jewelry.  Use a humidifier. This keeps the air moist, which helps to prevent dry skin. SEEK MEDICAL CARE IF:  The itching does not go away after several days.  You sweat at night.  You have weight loss.  You are unusually thirsty.  You urinate more than normal.  You are more tired than normal.  You have abdominal pain.  Your skin tingles.  You feel weak.  Your skin or the whites of your eyes look yellow (jaundice).  Your skin feels numb.   This information is not intended to replace advice given to you by your health care provider. Make sure you discuss any questions you have with your health care provider.   Document Released: 04/03/2011 Document Revised: 12/06/2014 Document Reviewed: 07/18/2014 Elsevier Interactive Patient Education [employer]2016 Elsevier Inc.

## 2016-01-22 ENCOUNTER — Ambulatory Visit (INDEPENDENT_AMBULATORY_CARE_PROVIDER_SITE_OTHER): Payer: BLUE CROSS/BLUE SHIELD | Admitting: Family Medicine

## 2016-01-22 VITALS — BP 116/72 | HR 71 | Temp 98.3°F | Resp 18 | Ht 71.5 in | Wt 163.0 lb

## 2016-01-22 DIAGNOSIS — L259 Unspecified contact dermatitis, unspecified cause: Secondary | ICD-10-CM | POA: Diagnosis not present

## 2016-01-22 DIAGNOSIS — L237 Allergic contact dermatitis due to plants, except food: Secondary | ICD-10-CM

## 2016-01-22 MED ORDER — PREDNISONE 20 MG PO TABS
ORAL_TABLET | ORAL | Status: DC
Start: 2016-01-22 — End: 2020-10-31

## 2016-01-22 NOTE — Progress Notes (Signed)
Patient ID: Jeffrey HippsDarren King, male    DOB: 09/23/1980  Age: 35 y.o. MRN: [id number]  Chief Complaint  Patient presents with  . Rash    HAS WORSEN, CREAM NOT WORKING, CONCERN WITH POISON IVY    Subjective:   Patient was seen a few days ago for a rash which was thought to be scabietic. It turns out he been using a weed eater and probably sprayed poison on his legs therefore making the little punctate rash rather than a more typical contact dermatitis appearance. At any rate his grandmother told him that it looked like poison ivy now. He says it is gotten worse.  Current allergies, medications, problem list, past/family and social histories reviewed.  Objective:  BP 116/72 mmHg  Pulse 71  Temp(Src) 98.3 F (36.8 C) (Oral)  Resp 18  Ht 5' 11.5" (1.816 m)  Wt 163 lb (73.936 kg)  BMI 22.42 kg/m2  SpO2 99%  Multiple punctate areas of rash on his lower extremities, with some areas of linear excoriation. This looks very consistent with poison ivy it is been spread by the weed eater.  Assessment & Plan:   Assessment: No diagnosis found.    Plan: See instructions  No orders of the defined types were placed in this encounter.    No orders of the defined types were placed in this encounter.         Patient Instructions       IF you received an x-ray today, you will receive an invoice [facility]. Please contact [facility] at [phone] with questions or concerns regarding your invoice.   IF you received labwork today, you will receive an invoice from [facility]. Please contact [facility] at [phone] with questions or concerns regarding your invoice.   Our billing staff will not be able to assist you with questions regarding bills from these companies.  You will be contacted with the lab results as soon as they are available. The fastest way to get your results is to activate your My Chart account. Instructions are located on  the last page of this paperwork. If you have not heard from us regarding the results in 2 weeks, please contact this office.         No Follow-up on file.   Livier Hendel, MD 01/22/2016

## 2016-01-22 NOTE — Patient Instructions (Addendum)
Wash well as discussed whenever possibly exposed to poison ivy  Take the prednisone 3 pills daily for 2 days, then 2 daily for 2 days, then 1 daily for 2 days, then one half daily. I recommend going ahead and taking the first 3 pills tonight, but after that take them in the morning after breakfast each day  Take Zyrtec (cetirizine) one daily as needed for itching  Continue to use the triamcinolone cream on the worst areas a couple of times a day  Return if problems arise    IF you received an x-ray today, you will receive an invoice from [facility]. Please contact [facility] at [phone] with questions or concerns regarding your invoice.   IF you received labwork today, you will receive an invoice from [facility]. Please contact [facility] at [phone] with questions or concerns regarding your invoice.   Our billing staff will not be able to assist you with questions regarding bills from these companies.  You will be contacted with the lab results as soon as they are available. The fastest way to get your results is to activate your My Chart account. Instructions are located on the last page of this paperwork. If you have not heard from us regarding the results in 2 weeks, please contact this office.

## 2016-01-29 DIAGNOSIS — L237 Allergic contact dermatitis due to plants, except food: Secondary | ICD-10-CM | POA: Diagnosis not present

## 2016-02-08 DIAGNOSIS — L237 Allergic contact dermatitis due to plants, except food: Secondary | ICD-10-CM | POA: Diagnosis not present

## 2016-02-11 ENCOUNTER — Other Ambulatory Visit: Payer: Self-pay | Admitting: Family Medicine

## 2016-07-23 DIAGNOSIS — M5386 Other specified dorsopathies, lumbar region: Secondary | ICD-10-CM | POA: Diagnosis not present

## 2016-07-23 DIAGNOSIS — M9905 Segmental and somatic dysfunction of pelvic region: Secondary | ICD-10-CM | POA: Diagnosis not present

## 2016-07-23 DIAGNOSIS — M9902 Segmental and somatic dysfunction of thoracic region: Secondary | ICD-10-CM | POA: Diagnosis not present

## 2016-07-23 DIAGNOSIS — M9903 Segmental and somatic dysfunction of lumbar region: Secondary | ICD-10-CM | POA: Diagnosis not present

## 2016-07-24 DIAGNOSIS — M9905 Segmental and somatic dysfunction of pelvic region: Secondary | ICD-10-CM | POA: Diagnosis not present

## 2016-07-24 DIAGNOSIS — M9902 Segmental and somatic dysfunction of thoracic region: Secondary | ICD-10-CM | POA: Diagnosis not present

## 2016-07-24 DIAGNOSIS — M5386 Other specified dorsopathies, lumbar region: Secondary | ICD-10-CM | POA: Diagnosis not present

## 2016-07-24 DIAGNOSIS — M9903 Segmental and somatic dysfunction of lumbar region: Secondary | ICD-10-CM | POA: Diagnosis not present

## 2016-07-26 DIAGNOSIS — M9903 Segmental and somatic dysfunction of lumbar region: Secondary | ICD-10-CM | POA: Diagnosis not present

## 2016-07-26 DIAGNOSIS — M9902 Segmental and somatic dysfunction of thoracic region: Secondary | ICD-10-CM | POA: Diagnosis not present

## 2016-07-26 DIAGNOSIS — M9905 Segmental and somatic dysfunction of pelvic region: Secondary | ICD-10-CM | POA: Diagnosis not present

## 2016-07-26 DIAGNOSIS — M5386 Other specified dorsopathies, lumbar region: Secondary | ICD-10-CM | POA: Diagnosis not present

## 2016-07-27 DIAGNOSIS — M9905 Segmental and somatic dysfunction of pelvic region: Secondary | ICD-10-CM | POA: Diagnosis not present

## 2016-07-27 DIAGNOSIS — M5386 Other specified dorsopathies, lumbar region: Secondary | ICD-10-CM | POA: Diagnosis not present

## 2016-07-27 DIAGNOSIS — M9903 Segmental and somatic dysfunction of lumbar region: Secondary | ICD-10-CM | POA: Diagnosis not present

## 2016-07-27 DIAGNOSIS — M9902 Segmental and somatic dysfunction of thoracic region: Secondary | ICD-10-CM | POA: Diagnosis not present

## 2016-07-30 DIAGNOSIS — M9902 Segmental and somatic dysfunction of thoracic region: Secondary | ICD-10-CM | POA: Diagnosis not present

## 2016-07-30 DIAGNOSIS — M9905 Segmental and somatic dysfunction of pelvic region: Secondary | ICD-10-CM | POA: Diagnosis not present

## 2016-07-30 DIAGNOSIS — M9903 Segmental and somatic dysfunction of lumbar region: Secondary | ICD-10-CM | POA: Diagnosis not present

## 2016-07-30 DIAGNOSIS — M5386 Other specified dorsopathies, lumbar region: Secondary | ICD-10-CM | POA: Diagnosis not present

## 2016-07-31 DIAGNOSIS — M9903 Segmental and somatic dysfunction of lumbar region: Secondary | ICD-10-CM | POA: Diagnosis not present

## 2016-07-31 DIAGNOSIS — M9905 Segmental and somatic dysfunction of pelvic region: Secondary | ICD-10-CM | POA: Diagnosis not present

## 2016-07-31 DIAGNOSIS — M5386 Other specified dorsopathies, lumbar region: Secondary | ICD-10-CM | POA: Diagnosis not present

## 2016-07-31 DIAGNOSIS — M9902 Segmental and somatic dysfunction of thoracic region: Secondary | ICD-10-CM | POA: Diagnosis not present

## 2016-08-01 DIAGNOSIS — M9902 Segmental and somatic dysfunction of thoracic region: Secondary | ICD-10-CM | POA: Diagnosis not present

## 2016-08-01 DIAGNOSIS — M9905 Segmental and somatic dysfunction of pelvic region: Secondary | ICD-10-CM | POA: Diagnosis not present

## 2016-08-01 DIAGNOSIS — M9903 Segmental and somatic dysfunction of lumbar region: Secondary | ICD-10-CM | POA: Diagnosis not present

## 2016-08-01 DIAGNOSIS — M5386 Other specified dorsopathies, lumbar region: Secondary | ICD-10-CM | POA: Diagnosis not present

## 2016-08-07 DIAGNOSIS — M9902 Segmental and somatic dysfunction of thoracic region: Secondary | ICD-10-CM | POA: Diagnosis not present

## 2016-08-07 DIAGNOSIS — M9903 Segmental and somatic dysfunction of lumbar region: Secondary | ICD-10-CM | POA: Diagnosis not present

## 2016-08-07 DIAGNOSIS — M9905 Segmental and somatic dysfunction of pelvic region: Secondary | ICD-10-CM | POA: Diagnosis not present

## 2016-08-07 DIAGNOSIS — M5386 Other specified dorsopathies, lumbar region: Secondary | ICD-10-CM | POA: Diagnosis not present

## 2016-08-08 DIAGNOSIS — M9905 Segmental and somatic dysfunction of pelvic region: Secondary | ICD-10-CM | POA: Diagnosis not present

## 2016-08-08 DIAGNOSIS — M5386 Other specified dorsopathies, lumbar region: Secondary | ICD-10-CM | POA: Diagnosis not present

## 2016-08-08 DIAGNOSIS — M9903 Segmental and somatic dysfunction of lumbar region: Secondary | ICD-10-CM | POA: Diagnosis not present

## 2016-08-08 DIAGNOSIS — M9902 Segmental and somatic dysfunction of thoracic region: Secondary | ICD-10-CM | POA: Diagnosis not present

## 2016-08-09 DIAGNOSIS — M9902 Segmental and somatic dysfunction of thoracic region: Secondary | ICD-10-CM | POA: Diagnosis not present

## 2016-08-09 DIAGNOSIS — M9903 Segmental and somatic dysfunction of lumbar region: Secondary | ICD-10-CM | POA: Diagnosis not present

## 2016-08-09 DIAGNOSIS — M9905 Segmental and somatic dysfunction of pelvic region: Secondary | ICD-10-CM | POA: Diagnosis not present

## 2016-08-09 DIAGNOSIS — M5386 Other specified dorsopathies, lumbar region: Secondary | ICD-10-CM | POA: Diagnosis not present

## 2016-08-14 DIAGNOSIS — M9905 Segmental and somatic dysfunction of pelvic region: Secondary | ICD-10-CM | POA: Diagnosis not present

## 2016-08-14 DIAGNOSIS — M5386 Other specified dorsopathies, lumbar region: Secondary | ICD-10-CM | POA: Diagnosis not present

## 2016-08-14 DIAGNOSIS — M9903 Segmental and somatic dysfunction of lumbar region: Secondary | ICD-10-CM | POA: Diagnosis not present

## 2016-08-14 DIAGNOSIS — M9902 Segmental and somatic dysfunction of thoracic region: Secondary | ICD-10-CM | POA: Diagnosis not present

## 2016-08-16 DIAGNOSIS — M9902 Segmental and somatic dysfunction of thoracic region: Secondary | ICD-10-CM | POA: Diagnosis not present

## 2016-08-16 DIAGNOSIS — M9905 Segmental and somatic dysfunction of pelvic region: Secondary | ICD-10-CM | POA: Diagnosis not present

## 2016-08-16 DIAGNOSIS — M9903 Segmental and somatic dysfunction of lumbar region: Secondary | ICD-10-CM | POA: Diagnosis not present

## 2016-08-16 DIAGNOSIS — M5386 Other specified dorsopathies, lumbar region: Secondary | ICD-10-CM | POA: Diagnosis not present

## 2016-08-26 DIAGNOSIS — M9902 Segmental and somatic dysfunction of thoracic region: Secondary | ICD-10-CM | POA: Diagnosis not present

## 2016-08-26 DIAGNOSIS — M9903 Segmental and somatic dysfunction of lumbar region: Secondary | ICD-10-CM | POA: Diagnosis not present

## 2016-08-26 DIAGNOSIS — M5386 Other specified dorsopathies, lumbar region: Secondary | ICD-10-CM | POA: Diagnosis not present

## 2016-08-26 DIAGNOSIS — M9905 Segmental and somatic dysfunction of pelvic region: Secondary | ICD-10-CM | POA: Diagnosis not present

## 2016-08-28 DIAGNOSIS — M9902 Segmental and somatic dysfunction of thoracic region: Secondary | ICD-10-CM | POA: Diagnosis not present

## 2016-08-28 DIAGNOSIS — M5386 Other specified dorsopathies, lumbar region: Secondary | ICD-10-CM | POA: Diagnosis not present

## 2016-08-28 DIAGNOSIS — M9903 Segmental and somatic dysfunction of lumbar region: Secondary | ICD-10-CM | POA: Diagnosis not present

## 2016-08-28 DIAGNOSIS — M9905 Segmental and somatic dysfunction of pelvic region: Secondary | ICD-10-CM | POA: Diagnosis not present

## 2016-08-29 DIAGNOSIS — M9902 Segmental and somatic dysfunction of thoracic region: Secondary | ICD-10-CM | POA: Diagnosis not present

## 2016-08-29 DIAGNOSIS — M5386 Other specified dorsopathies, lumbar region: Secondary | ICD-10-CM | POA: Diagnosis not present

## 2016-08-29 DIAGNOSIS — M9903 Segmental and somatic dysfunction of lumbar region: Secondary | ICD-10-CM | POA: Diagnosis not present

## 2016-08-29 DIAGNOSIS — M9905 Segmental and somatic dysfunction of pelvic region: Secondary | ICD-10-CM | POA: Diagnosis not present

## 2016-09-02 DIAGNOSIS — M5386 Other specified dorsopathies, lumbar region: Secondary | ICD-10-CM | POA: Diagnosis not present

## 2016-09-02 DIAGNOSIS — M9903 Segmental and somatic dysfunction of lumbar region: Secondary | ICD-10-CM | POA: Diagnosis not present

## 2016-09-02 DIAGNOSIS — M9905 Segmental and somatic dysfunction of pelvic region: Secondary | ICD-10-CM | POA: Diagnosis not present

## 2016-09-02 DIAGNOSIS — M9902 Segmental and somatic dysfunction of thoracic region: Secondary | ICD-10-CM | POA: Diagnosis not present

## 2016-09-04 DIAGNOSIS — M9903 Segmental and somatic dysfunction of lumbar region: Secondary | ICD-10-CM | POA: Diagnosis not present

## 2016-09-04 DIAGNOSIS — M9902 Segmental and somatic dysfunction of thoracic region: Secondary | ICD-10-CM | POA: Diagnosis not present

## 2016-09-04 DIAGNOSIS — M5386 Other specified dorsopathies, lumbar region: Secondary | ICD-10-CM | POA: Diagnosis not present

## 2016-09-04 DIAGNOSIS — M9905 Segmental and somatic dysfunction of pelvic region: Secondary | ICD-10-CM | POA: Diagnosis not present

## 2016-09-05 DIAGNOSIS — M5386 Other specified dorsopathies, lumbar region: Secondary | ICD-10-CM | POA: Diagnosis not present

## 2016-09-05 DIAGNOSIS — M9903 Segmental and somatic dysfunction of lumbar region: Secondary | ICD-10-CM | POA: Diagnosis not present

## 2016-09-05 DIAGNOSIS — M9902 Segmental and somatic dysfunction of thoracic region: Secondary | ICD-10-CM | POA: Diagnosis not present

## 2016-09-05 DIAGNOSIS — M9905 Segmental and somatic dysfunction of pelvic region: Secondary | ICD-10-CM | POA: Diagnosis not present

## 2016-10-17 DIAGNOSIS — Z0000 Encounter for general adult medical examination without abnormal findings: Secondary | ICD-10-CM | POA: Diagnosis not present

## 2016-10-21 DIAGNOSIS — M419 Scoliosis, unspecified: Secondary | ICD-10-CM | POA: Diagnosis not present

## 2016-10-21 DIAGNOSIS — Z1389 Encounter for screening for other disorder: Secondary | ICD-10-CM | POA: Diagnosis not present

## 2016-10-21 DIAGNOSIS — Z6822 Body mass index (BMI) 22.0-22.9, adult: Secondary | ICD-10-CM | POA: Diagnosis not present

## 2016-10-21 DIAGNOSIS — Z0000 Encounter for general adult medical examination without abnormal findings: Secondary | ICD-10-CM | POA: Diagnosis not present

## 2016-10-21 DIAGNOSIS — H538 Other visual disturbances: Secondary | ICD-10-CM | POA: Diagnosis not present

## 2016-11-06 DIAGNOSIS — H52203 Unspecified astigmatism, bilateral: Secondary | ICD-10-CM | POA: Diagnosis not present

## 2016-11-06 DIAGNOSIS — H1789 Other corneal scars and opacities: Secondary | ICD-10-CM | POA: Diagnosis not present

## 2017-05-19 ENCOUNTER — Encounter: Payer: Self-pay | Admitting: Physician Assistant

## 2017-05-19 ENCOUNTER — Ambulatory Visit (INDEPENDENT_AMBULATORY_CARE_PROVIDER_SITE_OTHER): Payer: BLUE CROSS/BLUE SHIELD | Admitting: Physician Assistant

## 2017-05-19 VITALS — BP 108/70 | HR 81 | Temp 98.6°F | Resp 16 | Ht 71.5 in | Wt 164.6 lb

## 2017-05-19 DIAGNOSIS — H1031 Unspecified acute conjunctivitis, right eye: Secondary | ICD-10-CM

## 2017-05-19 MED ORDER — POLYMYXIN B-TRIMETHOPRIM 10000-0.1 UNIT/ML-% OP SOLN: 1.0000 [drp] | OPHTHALMIC | 0 refills | Status: DC

## 2017-05-19 NOTE — Progress Notes (Signed)
    05/19/2017 3:45 PM   DOB: 1980-09-18 / MRN: [id number]  SUBJECTIVE:  Jeffrey King is a 36 y.o. male presenting for right eye redness that started after working on some tile at his home yesterday.  Denies change in vision, foreign body sensation, frank eye pain, and HA today. Tells me he had a stinging sensation last night but this is gone now and he is here today mostly doe to redness.   He has No Known Allergies.   He  has a past medical history of Allergy and Scoliosis.    He  reports that he has quit smoking. He has never used smokeless tobacco. He reports that he drinks alcohol. He reports that he does not use drugs. He  has no sexual activity history on file. The patient  has no past surgical history on file.  His family history includes Hypertension in his father.  Review of Systems  Constitutional: Negative for chills, diaphoresis and fever.  Respiratory: Negative for shortness of breath.   Cardiovascular: Negative for chest pain, orthopnea and leg swelling.  Gastrointestinal: Negative for nausea.  Skin: Negative for rash.  Neurological: Negative for dizziness.    The problem list and medications were reviewed and updated by myself where necessary and exist elsewhere in the encounter.   OBJECTIVE:  BP 108/70 (BP Location: Left Arm, Patient Position: Sitting, Cuff Size: Normal)   Pulse 81   Temp 98.6 F (37 C) (Oral)   Resp 16   Ht 5' 11.5" (1.816 m)   Wt 164 lb 9.6 oz (74.7 kg)   BMI 22.64 kg/m   Physical Exam  Constitutional: He appears well-developed. He is active and cooperative.  Non-toxic appearance.  Eyes: Pupils are equal, round, and reactive to light. EOM are normal. Right eye exhibits no discharge, no exudate and no hordeolum. No foreign body present in the right eye. Left eye exhibits no discharge, no exudate and no hordeolum. No foreign body present in the left eye. Right conjunctiva is injected. Right conjunctiva has no hemorrhage. Left conjunctiva is not  injected. Left conjunctiva has no hemorrhage.  Cardiovascular: Normal rate.   Pulmonary/Chest: Effort normal. No tachypnea.  Neurological: He is alert.  Skin: Skin is warm and dry. He is not diaphoretic. No pallor.  Vitals reviewed.    Visual Acuity Screening   Right eye Left eye Both eyes  Without correction: 20/25  20/25  With correction:        No results found for this or any previous visit (from the past 72 hour(s)).  No results found.  ASSESSMENT AND PLAN:  Tal was seen today for eye irritation.  Diagnoses and all orders for this visit:  Acute conjunctivitis of right eye, unspecified acute conjunctivitis type: No red flags.  Most likely a foreign body that has worked its way out.  -     trimethoprim-polymyxin b (POLYTRIM) ophthalmic solution; Place 1 drop into the left eye every 4 (four) hours.    The patient is advised to call or return to clinic if he does not see an improvement in symptoms, or to seek the care of the closest emergency department if he worsens with the above plan.   Deliah Boston, MHS, PA-C Primary Care at [facility] 05/19/2017 3:45 PM

## 2017-05-19 NOTE — Patient Instructions (Addendum)
  Hold the drops as long as you continue to improve with regard to pain.  If you start to see pus coming from the eye then fill. If you vision changes or you begin to have frank eye pain then call [facility] or come back here.    IF you received an x-ray today, you will receive an invoice from [facility]. Please contact [facility] at [phone] with questions or concerns regarding your invoice.   IF you received labwork today, you will receive an invoice from [facility]. Please contact [facility] at [phone] with questions or concerns regarding your invoice.   Our billing staff will not be able to assist you with questions regarding bills from these companies.  You will be contacted with the lab results as soon as they are available. The fastest way to get your results is to activate your My Chart account. Instructions are located on the last page of this paperwork. If you have not heard from [place] regarding the results in 2 weeks, please contact this office.

## 2017-10-20 DIAGNOSIS — Z0000 Encounter for general adult medical examination without abnormal findings: Secondary | ICD-10-CM | POA: Diagnosis not present

## 2017-10-24 ENCOUNTER — Emergency Department (HOSPITAL_COMMUNITY)
Admission: EM | Admit: 2017-10-24 | Discharge: 2017-10-24 | Disposition: A | Discharge status: Home / Self Care | Payer: BLUE CROSS/BLUE SHIELD | Attending: Emergency Medicine | Admitting: Emergency Medicine

## 2017-10-24 ENCOUNTER — Encounter (HOSPITAL_COMMUNITY): Payer: Self-pay

## 2017-10-24 DIAGNOSIS — M546 Pain in thoracic spine: Secondary | ICD-10-CM | POA: Insufficient documentation

## 2017-10-24 DIAGNOSIS — M549 Dorsalgia, unspecified: Secondary | ICD-10-CM | POA: Diagnosis not present

## 2017-10-24 DIAGNOSIS — Z79899 Other long term (current) drug therapy: Secondary | ICD-10-CM | POA: Insufficient documentation

## 2017-10-24 DIAGNOSIS — M419 Scoliosis, unspecified: Secondary | ICD-10-CM | POA: Insufficient documentation

## 2017-10-24 DIAGNOSIS — Z87891 Personal history of nicotine dependence: Secondary | ICD-10-CM | POA: Insufficient documentation

## 2017-10-24 LAB — URINALYSIS, ROUTINE W REFLEX MICROSCOPIC
BILIRUBIN URINE: NEGATIVE
Glucose, UA: NEGATIVE mg/dL
Hgb urine dipstick: NEGATIVE
KETONES UR: NEGATIVE mg/dL
Leukocytes, UA: NEGATIVE
NITRITE: NEGATIVE
PROTEIN: NEGATIVE mg/dL
Specific Gravity, Urine: 1.014 (ref 1.005–1.030)
pH: 6 (ref 5.0–8.0)

## 2017-10-24 MED ORDER — METHOCARBAMOL 750 MG PO TABS
750.0000 mg | ORAL_TABLET | Freq: Three times a day (TID) | ORAL | 0 refills | Status: DC | PRN
Start: 2017-10-24 — End: 2020-10-31

## 2017-10-24 MED ORDER — DIAZEPAM 5 MG PO TABS
5.0000 mg | ORAL_TABLET | Freq: Once | ORAL | Status: AC
  Administered 2017-10-24: 5 mg via ORAL
  Filled 2017-10-24: qty 1

## 2017-10-24 NOTE — ED Triage Notes (Signed)
Pt presents for evaluation of right sided back pain. Pt reports has worsened over past 3 days.

## 2017-10-24 NOTE — Discharge Instructions (Signed)
Today you received medications that may make you sleepy or impair your ability to make decisions.  For the next 24 hours please do not drive, operate heavy machinery, care for a small child with out another adult present, or perform any activities that may cause harm to you or someone else if you were to fall asleep or be impaired.   You are being prescribed a medication which may make you sleepy. Please follow up of listed precautions for at least 24 hours after taking one dose.  Please take Ibuprofen (Advil, motrin) and Tylenol (acetaminophen) to relieve your pain.  You may take up to 600 MG (3 pills) of normal strength ibuprofen every 8 hours as needed.  In between doses of ibuprofen you make take tylenol, up to 1,000 mg (two extra strength pills).  Do not take more than 3,000 mg tylenol in a 24 hour period.  Please check all medication labels as many medications such as pain and cold medications may contain tylenol.  Do not drink alcohol while taking these medications.  Do not take other NSAID'S while taking ibuprofen (such as aleve or naproxen).  Please take ibuprofen with food to decrease stomach upset.  Please follow-up with your primary care provider regarding your ED visit and symptoms today.  Please seek additional medical care and evaluation if you have any concerns, develop fevers, abdominal pain, nausea vomiting diarrhea, numbness or tingling in your legs or arms or have any worsening symptoms.  Please do not take your Robaxin for at least 6 hours after your Valium pill.  Please be aware that if your drug tested in the next 30 or so days that may be positive for benzodiazepines.

## 2017-10-24 NOTE — ED Notes (Signed)
Gave pt cup of water and cup and told pt we needed a urine sample.

## 2017-10-24 NOTE — ED Provider Notes (Signed)
[facility] EM[facility]GENCY [facility] Provider Note   CSN: [id number] Arrival date & time: 10/24/17  1243     History   Chief Complaint Chief Complaint  Patient presents with  . Back Pain    HPI Jeffrey King is a 37 y.o. male with history of scoliosis, kidney stones, back pain date for evaluation of right-sided mid back pain.  Gradually worsening over the past 2-3 days. Denies any fevers, no changes to bowel or bladder function, no numbness or tingling to legs.  He does not have any personal history of cancer or IV drug use.  He reports that he does have a history of kidney stones feels like this is more of muscle pain.  He denies any burning with urination.  No fevers or chills.    HPI  Past Medical History:  Diagnosis Date  . Allergy   . Scoliosis    mild    Patient Active Problem List   Diagnosis Date Noted  . Allergic rhinitis 01/31/2014    History reviewed. No pertinent surgical history.      Home Medications    Prior to Admission medications   Medication Sig Start Date End Date Taking? Authorizing Provider  fluticasone (FLONASE) 50 MCG/ACT nasal spray Place 2 sprays into both nostrils daily. 01/20/14   Daine GipVoss, Erin L, MD  methocarbamol (ROBAXIN) 750 MG tablet Take 1-2 tablets (750-1,500 mg total) by mouth 3 (three) times daily as needed for muscle spasms. 10/24/17   Cristina GongHammond, Elizabeth W, PA-C  predniSONE (DELTASONE) 20 MG tablet Take 3 daily for 2 days, then 2 daily for 2 days, then 1 daily for 2 days, then one half daily 01/22/16   Peyton NajjarHopper, David H, MD  triamcinolone cream (KENALOG) 0.1 % Apply 1 application topically 2 (two) times daily. 01/19/16   Shade FloodGreene, Jeffrey R, MD  trimethoprim-polymyxin b (POLYTRIM) ophthalmic solution Place 1 drop into the left eye every 4 (four) hours. 05/19/17   Ofilia Neaslark, Michael L, PA-C    Family History Family History  Problem Relation Age of Onset  . Hypertension Father     Social History Social History   Tobacco Use  .  Smoking status: Former Games developermoker  . Smokeless tobacco: Never Used  Substance Use Topics  . Alcohol use: Yes    Comment: rare socially  . Drug use: No     Allergies   Patient has no known allergies.   Review of Systems Review of Systems  Constitutional: Negative for chills and fever.  HENT: Negative for congestion.   Genitourinary: Positive for flank pain. Negative for dysuria, frequency, hematuria and urgency.  Musculoskeletal: Positive for back pain. Negative for neck pain and neck stiffness.  Skin: Negative for pallor and wound.  Neurological: Negative for weakness and numbness.  All other systems reviewed and are negative.    Physical Exam Updated Vital Signs BP (!) 125/95 (BP Location: Right Arm)   Pulse 78   Temp 98.5 F (36.9 C) (Oral)   Resp 16   SpO2 98%   Physical Exam  Constitutional: He is oriented to person, place, and time. He appears well-developed and well-nourished.  HENT:  Head: Normocephalic and atraumatic.  Cardiovascular: Normal rate, normal heart sounds and intact distal pulses.  No murmur heard. 2+ DP/PT pulses bilaterally   Pulmonary/Chest: Effort normal and breath sounds normal. No stridor. He has no wheezes.  Abdominal: Soft. Bowel sounds are normal. He exhibits no distension. There is no tenderness.  Musculoskeletal:  There is left sided mid  back/flank pain.  This left-sided flank pain is worse with superficial palpation then with percussion.  Muscles on the side feel tight, consistent with spasm.  No midline spine ttp.  No lower/lumbar back pain/TTP.    Neurological: He is alert and oriented to person, place, and time.  Sensation intact to bilateral lower extremities.   Skin: Skin is warm and dry. He is not diaphoretic.  Psychiatric: He has a normal mood and affect. His behavior is normal.  Nursing note and vitals reviewed.    ED Treatments / Results  Labs (all labs ordered are listed, but only abnormal results are displayed) Labs  Reviewed  URINALYSIS, ROUTINE W REFLEX MICROSCOPIC    EKG None  Radiology No results found.  Procedures Procedures (including critical care time)  Medications Ordered in ED Medications  diazepam (VALIUM) tablet 5 mg (5 mg Oral Given 10/24/17 1438)     Initial Impression / Assessment and Plan / ED Course  I have reviewed the triage vital signs and the nursing notes.  Pertinent labs & imaging results that were available during my care of the patient were reviewed by me and considered in my medical decision making (see chart for details).  Clinical Course as of Oct 24 1720  Fri Oct 24, 2017  1527 Patient reevaluated, is much more comfortable, ready for discharge.   [EH]    Clinical Course User Index [EH] Cristina Gong, PA-C    Patient with back pain.  No neurological deficits and normal neuro exam.  Patient can walk but states is painful.  No loss of bowel or bladder control.  No concern for cauda equina.  No fever, night sweats, weight loss, h/o cancer, IVDU.  Patient does have a history of kidney stones, we discussed that even a high percent of people with a stone may not have blood in their urine, however UA obtained and no hematuria.  RICE protocol and pain medicine indicated and discussed with patient.  PCP follow up.   Final Clinical Impressions(s) / ED Diagnoses   Final diagnoses:  Acute right-sided thoracic back pain    ED Discharge Orders        Ordered    methocarbamol (ROBAXIN) 750 MG tablet  3 times daily PRN     10/24/17 1527       Cristina Gong, [place] 10/24/17 Tonna Boehringer, MD 10/25/17 1055

## 2017-12-03 DIAGNOSIS — Z6822 Body mass index (BMI) 22.0-22.9, adult: Secondary | ICD-10-CM | POA: Diagnosis not present

## 2017-12-03 DIAGNOSIS — Z1389 Encounter for screening for other disorder: Secondary | ICD-10-CM | POA: Diagnosis not present

## 2017-12-03 DIAGNOSIS — Z0000 Encounter for general adult medical examination without abnormal findings: Secondary | ICD-10-CM | POA: Diagnosis not present

## 2018-08-11 DIAGNOSIS — Z0001 Encounter for general adult medical examination with abnormal findings: Secondary | ICD-10-CM | POA: Diagnosis not present

## 2018-08-11 DIAGNOSIS — K529 Noninfective gastroenteritis and colitis, unspecified: Secondary | ICD-10-CM | POA: Diagnosis not present

## 2018-08-11 DIAGNOSIS — R21 Rash and other nonspecific skin eruption: Secondary | ICD-10-CM | POA: Diagnosis not present

## 2018-08-11 DIAGNOSIS — Z0000 Encounter for general adult medical examination without abnormal findings: Secondary | ICD-10-CM | POA: Diagnosis not present

## 2018-08-26 DIAGNOSIS — R21 Rash and other nonspecific skin eruption: Secondary | ICD-10-CM | POA: Diagnosis not present

## 2018-08-26 DIAGNOSIS — K219 Gastro-esophageal reflux disease without esophagitis: Secondary | ICD-10-CM | POA: Diagnosis not present

## 2020-10-31 ENCOUNTER — Ambulatory Visit: Payer: 59 | Admitting: Gastroenterology

## 2020-10-31 ENCOUNTER — Encounter: Payer: Self-pay | Admitting: Gastroenterology

## 2020-10-31 VITALS — BP 90/60 | HR 82 | Ht 71.5 in | Wt 171.0 lb

## 2020-10-31 DIAGNOSIS — R1013 Epigastric pain: Secondary | ICD-10-CM | POA: Diagnosis not present

## 2020-10-31 NOTE — Progress Notes (Signed)
[facility] Consult Note:  History: Jeffrey King 10/31/2020  Referring provider: Alysia Penna, MD  Reason for consult/chief complaint: Gastroesophageal Reflux (Epigastric and chest pain, regurgitation of acid, excessive gas and bloating; symptoms occur early morning)   Subjective  HPI:  This is a 40 year old man referred by primary care for chronic epigastric pain.  Jeffrey King tells me that he had problems with indigestion back to his adolescence, and describes having had an unsedated upper endoscopy at about age 3 for the symptoms, and he does not recall the findings but was just told to stop eating spicy food.  Over the next 25 years he has had intermittent indigestion with epigastric pain and lower sternal burning that he attributes perhaps to certain foods.  He has just come to live with it and has not really sought medical attention for it, but symptoms seem worse in the last several years.  He has bloating belching and gassiness as well.  He has occasionally taken as needed doses of acid suppression, but nothing on a regular basis.  He denies dysphagia or odynophagia.  He is troubled by eczema, especially on his hands and he has an appointment to see dermatology in May. Jeffrey King does not know if he is ever been tested or treated for H. Pylori. Bowel habits are regular without rectal bleeding.  ROS:  Review of Systems  Constitutional: Negative for appetite change and unexpected weight change.  HENT: Negative for mouth sores and voice change.   Eyes: Negative for pain and redness.  Respiratory: Negative for cough and shortness of breath.   Cardiovascular: Negative for chest pain and palpitations.  Genitourinary: Negative for dysuria and hematuria.  Musculoskeletal: Negative for arthralgias and myalgias.  Skin: Negative for pallor and rash.  Neurological: Negative for weakness and headaches.  Hematological: Negative for adenopathy.     Past Medical  History: Past Medical History:  Diagnosis Date  . Allergic rhinitis   . Allergy   . Eczema   . GERD (gastroesophageal reflux disease)   . Scoliosis    mild  . Viral wart      Past Surgical History: Past Surgical History:  Procedure Laterality Date  . LASIK       Family History: Family History  Problem Relation Age of Onset  . Hypertension Father     Social History: Social History   Socioeconomic History  . Marital status: Single    Spouse name: n/a  . Number of children: 3  . Years of education: 26  . Highest education level: Not on file  Occupational History  . Occupation: nail tech    Comment: [employer]  Tobacco Use  . Smoking status: Light Tobacco Smoker  . Smokeless tobacco: Never Used  Substance and Sexual Activity  . Alcohol use: Yes    Comment: rare socially  . Drug use: No  . Sexual activity: Yes  Other Topics Concern  . Not on file  Social History Narrative   Lives with his 3 children. His family lives in [place].   Social Determinants of Health   Financial Resource Strain: Not on file  Food Insecurity: Not on file  Transportation Needs: Not on file  Physical Activity: Not on file  Stress: Not on file  Social Connections: Not on file    Allergies: No Known Allergies  Outpatient Meds: No current outpatient medications on file.   No current facility-administered medications for this visit.    He was born in the [place] to [place] parents,  and his mother still lives in [place]. He operates a [employer] and is married with 3 children.  ___________________________________________________________________ Objective   Exam:  BP 90/60   Pulse 82   Ht 5' 11.5" (1.816 m)   Wt 171 lb (77.6 kg)   BMI 23.52 kg/m  Wt Readings from Last 3 Encounters:  10/31/20 171 lb (77.6 kg)  05/19/17 164 lb 9.6 oz (74.7 kg)  01/22/16 163 lb (73.9 kg)     General: Well-appearing, normal vocal quality  Eyes: sclera anicteric, no redness  ENT: oral  mucosa moist without lesions, no cervical or supraclavicular lymphadenopathy  CV: RRR without murmur, S1/S2, no JVD, no peripheral edema  Resp: clear to auscultation bilaterally, normal RR and effort noted  GI: soft, no tenderness, with active bowel sounds. No guarding or palpable organomegaly noted.  Skin; warm and dry.  Eczema on his hands.  He also has a marked callus on one of his fingers, and he suspects that may be work-related.  He has reportedly had evaluation for that in the past.  Neuro: awake, alert and oriented x 3. Normal gross motor function and fluent speech  Labs:  No data for review  Assessment: Encounter Diagnosis  Name Primary?  . Epigastric pain Yes    Chronic epigastric/lower sternal pain.  It sounds like gastritis and/or reflux, consider H. pylori especially given his ethnicity. No dysphagia vomiting or weight loss. Although he has eczema, he does not have dysphagia to suggest eosinophilic esophagitis.  This will certainly be evaluated for with endoscopic testing Plan:  Upper endoscopy with biopsies to rule out H. pylori.  He was agreeable after discussion of procedure and risks.  The benefits and risks of the planned procedure were described in detail with the patient or (when appropriate) their health care proxy.  Risks were outlined as including, but not limited to, bleeding, infection, perforation, adverse medication reaction leading to cardiac or pulmonary decompensation, pancreatitis (if ERCP).  The limitation of incomplete mucosal visualization was also discussed.  No guarantees or warranties were given.  Referral to Dr. Selena Batten at [facility] allergy and immunology regarding this patient's eczema.  Thank you for the courtesy of this consult.  Please call me with any questions or concerns.  Jeffrey King  CC: Referring provider noted above

## 2020-10-31 NOTE — Patient Instructions (Addendum)
If you are age 40 or older, your body mass index should be between 23-30. Your Body mass index is 23.52 kg/m. If this is out of the aforementioned range listed, please consider follow up with your Primary Care Provider.  If you are age 67 or younger, your body mass index should be between 19-25. Your Body mass index is 23.52 kg/m. If this is out of the aformentioned range listed, please consider follow up with your Primary Care Provider.   You have been scheduled for an endoscopy. Please follow written instructions given to you at your visit today. If you use inhalers (even only as needed), please bring them with you on the day of your procedure.  We have sent a referral request to Dr Wyline Mood. They should reach out to you to schedule.   [address] [place], [place] [address] [phone]  It was a pleasure to see you today!  Dr. Myrtie Neither

## 2020-11-14 ENCOUNTER — Encounter: Payer: Self-pay | Admitting: Certified Registered Nurse Anesthetist

## 2020-11-15 ENCOUNTER — Other Ambulatory Visit: Payer: Self-pay

## 2020-11-15 ENCOUNTER — Encounter: Payer: Self-pay | Admitting: Gastroenterology

## 2020-11-15 ENCOUNTER — Ambulatory Visit (AMBULATORY_SURGERY_CENTER): Payer: 59 | Admitting: Gastroenterology

## 2020-11-15 VITALS — BP 98/63 | HR 68 | Temp 97.7°F | Resp 9 | Ht 71.0 in | Wt 171.0 lb

## 2020-11-15 DIAGNOSIS — R1013 Epigastric pain: Secondary | ICD-10-CM

## 2020-11-15 MED ORDER — SODIUM CHLORIDE 0.9 % IV SOLN: 500.0000 mL | Freq: Once | INTRAVENOUS | Status: DC

## 2020-11-15 NOTE — Progress Notes (Signed)
Called to room to assist during endoscopic procedure.  Patient ID and intended procedure confirmed with present staff. Received instructions for my participation in the procedure from the performing physician.  

## 2020-11-15 NOTE — Progress Notes (Signed)
Pt's states no medical or surgical changes since previsit or office visit.  SH vitals. 

## 2020-11-15 NOTE — Progress Notes (Signed)
1109 Robinul 0.1 mg IV given due large amount of secretions upon assessment.  MD made aware, vss 

## 2020-11-15 NOTE — Progress Notes (Signed)
Report given to PACU, vss 

## 2020-11-15 NOTE — Patient Instructions (Signed)
Await pathology  Per Dr. Myrtie Neither briefly avoid dairy to if lactose intolerance may be causing bloating  YOU HAD AN ENDOSCOPIC PROCEDURE TODAY AT THE [facility]:   Refer to the procedure report that was given to you for any specific questions about what was found during the examination.  If the procedure report does not answer your questions, please call your gastroenterologist to clarify.  If you requested that your care partner not be given the details of your procedure findings, then the procedure report has been included in a sealed envelope for you to review at your convenience later.  YOU SHOULD EXPECT: Some feelings of bloating in the abdomen. Passage of more gas than usual.  Walking can help get rid of the air that was put into your GI tract during the procedure and reduce the bloating.Please Note:  You might notice some irritation and congestion in your nose or some drainage.  This is from the oxygen used during your procedure.  There is no need for concern and it should clear up in a day or so.  SYMPTOMS TO REPORT IMMEDIATELY:   Following upper endoscopy (EGD)  Vomiting of blood or coffee ground material  New chest pain or pain under the shoulder blades  Painful or persistently difficult swallowing  New shortness of breath  Fever of 100F or higher  Black, tarry-looking stools  For urgent or emergent issues, a gastroenterologist can be reached at any hour by calling [phone]. Do not use MyChart messaging for urgent concerns.    DIET:  We do recommend a small meal at first, but then you may proceed to your regular diet.  Drink plenty of fluids but you should avoid alcoholic beverages for 24 hours.  ACTIVITY:  You should plan to take it easy for the rest of today and you should NOT DRIVE or use heavy machinery until tomorrow (because of the sedation medicines used during the test).    FOLLOW UP: Our staff will call the number listed on your records 48-72 hours  following your procedure to check on you and address any questions or concerns that you may have regarding the information given to you following your procedure. If we do not reach you, we will leave a message.  We will attempt to reach you two times.  During this call, we will ask if you have developed any symptoms of COVID 19. If you develop any symptoms (ie: fever, flu-like symptoms, shortness of breath, cough etc.) before then, please call [phone].  If you test positive for Covid 19 in the 2 weeks post procedure, please call and report this information to [place].    If any biopsies were taken you will be contacted by phone or by letter within the next 1-3 weeks.  Please call us at [phone] if you have not heard about the biopsies in 3 weeks.    SIGNATURES/CONFIDENTIALITY: You and/or your care partner have signed paperwork which will be entered into your electronic medical record.  These signatures attest to the fact that that the information above on your After Visit Summary has been reviewed and is understood.  Full responsibility of the confidentiality of this discharge information lies with you and/or your care-partner.

## 2020-11-15 NOTE — Op Note (Signed)
[facility] Patient Name: Jeffrey King Procedure Date: 11/15/2020 11:05 AM MRN: [id number] Endoscopist: Sherilyn Cooter L. Myrtie Neither , MD Age: 40 Referring MD:  Date of Birth: 1980/12/13 Gender: Male Account #: [id number] Procedure:                Upper GI endoscopy Indications:              Epigastric abdominal pain (25 yrs intermittent                            dyspepsia) Medicines:                Monitored Anesthesia Care Procedure:                Pre-Anesthesia Assessment:                           - Prior to the procedure, a History and Physical                            was performed, and patient medications and                            allergies were reviewed. The patient's tolerance of                            previous anesthesia was also reviewed. The risks                            and benefits of the procedure and the sedation                            options and risks were discussed with the patient.                            All questions were answered, and informed consent                            was obtained. Prior Anticoagulants: The patient has                            taken no previous anticoagulant or antiplatelet                            agents. ASA Grade Assessment: II - A patient with                            mild systemic disease. After reviewing the risks                            and benefits, the patient was deemed in                            satisfactory condition to undergo the procedure.  After obtaining informed consent, the endoscope was                            passed under direct vision. Throughout the                            procedure, the patient's blood pressure, pulse, and                            oxygen saturations were monitored continuously. The                            Olympus CF-HQ190L (Serial# [id number]) Colonoscope was                            introduced through the mouth, and advanced to the                             second part of duodenum. The upper GI endoscopy was                            accomplished without difficulty. The patient                            tolerated the procedure well. Scope In: Scope Out: Findings:                 The esophagus was normal.                           Striped mildly erythematous mucosa without bleeding                            was found in the prepyloric region of the stomach.                            Several biopsies were obtained in the gastric body                            and in the gastric antrum with cold forceps for                            histology (to rule out H. pylori).                           The exam of the stomach was otherwise normal.                           The examined duodenum was normal. Complications:            No immediate complications. Estimated Blood Loss:     Estimated blood loss was minimal. Impression:               - Normal esophagus.                           -  Erythematous mucosa in the prepyloric region of                            the stomach. Most likely of no clinical signficance.                           - Normal examined duodenum.                           - Several biopsies were obtained in the gastric                            body and in the gastric antrum. Recommendation:           - Patient has a contact number available for                            emergencies. The signs and symptoms of potential                            delayed complications were discussed with the                            patient. Return to normal activities tomorrow.                            Written discharge instructions were provided to the                            patient.                           - Resume previous diet.                           - Continue present medications.                           - Await pathology results.                           - Patient also advised to briefly avoid  dairy to                            see if lactose intolerance may be causing bloating. Michala Deblanc L. Myrtie Neither, MD 11/15/2020 11:29:17 AM This report has been signed electronically.

## 2020-11-20 ENCOUNTER — Telehealth: Payer: Self-pay

## 2020-11-20 NOTE — Telephone Encounter (Signed)
  Follow up Call-  Call back number 11/15/2020  Post procedure Call Back phone  # [phone]  Permission to leave phone message Yes  Some recent data might be hidden     Patient questions:  Do you have a fever, pain , or abdominal swelling? No. Pain Score  0 *  Have you tolerated food without any problems? Yes.    Have you been able to return to your normal activities? Yes.    Do you have any questions about your discharge instructions: Diet   No. Medications  No. Follow up visit  No.  Do you have questions or concerns about your Care? No.  Actions: * If pain score is 4 or above: No action needed, pain <4.  1. Have you developed a fever since your procedure? no  2.   Have you had an respiratory symptoms (SOB or cough) since your procedure? no  3.   Have you tested positive for COVID 19 since your procedure no  4.   Have you had any family members/close contacts diagnosed with the COVID 19 since your procedure?  no   If yes to any of these questions please route to Laverna Peace, RN and Karlton Lemon, RN

## 2020-11-27 ENCOUNTER — Encounter: Payer: Self-pay | Admitting: Gastroenterology

## 2020-12-21 ENCOUNTER — Encounter: Payer: Self-pay | Admitting: Emergency Medicine

## 2020-12-21 ENCOUNTER — Ambulatory Visit
Admission: EM | Admit: 2020-12-21 | Discharge: 2020-12-21 | Disposition: A | Discharge status: Home / Self Care | Payer: 59 | Attending: Internal Medicine | Admitting: Internal Medicine

## 2020-12-21 ENCOUNTER — Other Ambulatory Visit: Payer: Self-pay

## 2020-12-21 DIAGNOSIS — L237 Allergic contact dermatitis due to plants, except food: Secondary | ICD-10-CM

## 2020-12-21 MED ORDER — DEXAMETHASONE SODIUM PHOSPHATE 10 MG/ML IJ SOLN
10.0000 mg | Freq: Once | INTRAMUSCULAR | Status: AC
  Administered 2020-12-21: 10 mg via INTRAMUSCULAR

## 2020-12-21 NOTE — Discharge Instructions (Addendum)
Use TECNU wash to get rid of the oils

## 2020-12-21 NOTE — ED Provider Notes (Signed)
[facility]    CSN: [id number] Arrival date & time: 12/21/20  1608      History   Chief Complaint Chief Complaint  Patient presents with  . Rash    HPI Jeffrey King is a 40 y.o. male who presents with poison IV rash  1 week. He normally washes well after working in the yard to prevent it, but this time because he worked on 2 yard did not do it. Has been applying calamine lotion but it continues to spread. The rash is very itchy.   Past Medical History:  Diagnosis Date  . Allergic rhinitis   . Allergy   . Eczema   . GERD (gastroesophageal reflux disease)   . Scoliosis    mild  . Viral wart     Patient Active Problem List   Diagnosis Date Noted  . Allergic rhinitis 01/31/2014    Past Surgical History:  Procedure Laterality Date  . LASIK         Home Medications    Prior to Admission medications   Not on File    Family History Family History  Problem Relation Age of Onset  . Hypertension Father   . Colon cancer Neg Hx   . Esophageal cancer Neg Hx   . Rectal cancer Neg Hx   . Stomach cancer Neg Hx     Social History Social History   Tobacco Use  . Smoking status: Light Tobacco Smoker  . Smokeless tobacco: Never Used  Substance Use Topics  . Alcohol use: Yes    Comment: rare socially  . Drug use: No     Allergies   Patient has no known allergies.   Review of Systems Review of Systems + itchy rash on arms and neck and forehead  Physical Exam Triage Vital Signs ED Triage Vitals  Enc Vitals Group     BP 12/21/20 1746 111/78     Pulse Rate 12/21/20 1746 64     Resp 12/21/20 1746 18     Temp 12/21/20 1746 98.4 F (36.9 C)     Temp Source 12/21/20 1746 Oral     SpO2 12/21/20 1746 97 %     Weight --      Height --      Head Circumference --      Peak Flow --      Pain Score 12/21/20 1747 0     Pain Loc --      Pain Edu? --      Excl. in GC? --    No data found.  Updated Vital Signs BP 111/78 (BP Location: Right Arm)    Pulse 64   Temp 98.4 F (36.9 C) (Oral)   Resp 18   SpO2 97%   Visual Acuity Right Eye Distance:   Left Eye Distance:   Bilateral Distance:    Right Eye Near:   Left Eye Near:    Bilateral Near:     Physical Exam Vitals and nursing note reviewed.  Constitutional:      Appearance: He is normal weight.  HENT:     Right Ear: External ear normal.     Left Ear: External ear normal.  Eyes:     Conjunctiva/sclera: Conjunctivae normal.  Pulmonary:     Effort: Pulmonary effort is normal.  Musculoskeletal:        General: Normal range of motion.     Cervical back: Neck supple.  Skin:    General: Skin is warm and dry.  Findings: Rash present.     Comments: Has multiple papular rash on arms and chest, and few linear areas, L antecubital region with brownish thick skin coloration.   Neurological:     Mental Status: He is alert and oriented to person, place, and time.     Gait: Gait normal.  Psychiatric:        Mood and Affect: Mood normal.        Behavior: Behavior normal.        Thought Content: Thought content normal.        Judgment: Judgment normal.      UC Treatments / Results  Labs (all labs ordered are listed, but only abnormal results are displayed) Labs Reviewed - No data to display  EKG   Radiology No results found.  Procedures Procedures (including critical care time)  Medications Ordered in UC Medications  dexamethasone (DECADRON) injection 10 mg (has no administration in time range)    Initial Impression / Assessment and Plan / UC Course  I have reviewed the triage vital signs and the nursing notes. He was given Decadron 10 mg IM, and advised to use TECNU wash.    Final Clinical Impressions(s) / UC Diagnoses   Final diagnoses:  Poison ivy     Discharge Instructions     Use TECNU wash to get rid of the oils     ED Prescriptions    None     PDMP not reviewed this encounter.   Garey Ham, [place] 12/21/20  1814

## 2020-12-21 NOTE — ED Triage Notes (Signed)
Pt here for itchy rash to arms from poison ivy x 1 week

## 2020-12-25 ENCOUNTER — Other Ambulatory Visit: Payer: Self-pay

## 2020-12-25 ENCOUNTER — Ambulatory Visit (INDEPENDENT_AMBULATORY_CARE_PROVIDER_SITE_OTHER): Payer: 59 | Admitting: Allergy

## 2020-12-25 ENCOUNTER — Telehealth: Payer: Self-pay | Admitting: Allergy

## 2020-12-25 ENCOUNTER — Encounter: Payer: Self-pay | Admitting: Allergy

## 2020-12-25 VITALS — BP 128/80 | HR 71 | Temp 98.2°F | Resp 20 | Ht 72.0 in | Wt 173.6 lb

## 2020-12-25 DIAGNOSIS — L239 Allergic contact dermatitis, unspecified cause: Secondary | ICD-10-CM | POA: Insufficient documentation

## 2020-12-25 DIAGNOSIS — L237 Allergic contact dermatitis due to plants, except food: Secondary | ICD-10-CM | POA: Diagnosis not present

## 2020-12-25 DIAGNOSIS — B079 Viral wart, unspecified: Secondary | ICD-10-CM | POA: Insufficient documentation

## 2020-12-25 MED ORDER — TRIAMCINOLONE ACETONIDE 0.1 % EX OINT: 1.0000 "application " | TOPICAL_OINTMENT | Freq: Two times a day (BID) | CUTANEOUS | 2 refills | Status: AC | PRN

## 2020-12-25 MED ORDER — EUCRISA 2 % EX OINT: TOPICAL_OINTMENT | CUTANEOUS | 2 refills | Status: AC

## 2020-12-25 NOTE — Patient Instructions (Addendum)
Rash  See below for proper skin care - use fragrance free and dye free products.   Most likely the rash is due to contact dermatitis.   Try to wear a cotton glove underneath a regular glove when doing nail work.  Wash hands frequently and moisturize throughout the day.    Use Eucrisa (crisaborole) 2% ointment twice a day on mild eczema flares on the face and body. This is a non-steroid ointment.   If it burns, place the medication in the refrigerator.   Apply a thin layer of moisturizer and then apply the Eucrisa on top of it.  Use triamcinolone 0.1% ointment twice a day as needed for eczema flares. Do not use on the face, neck, armpits or groin area. Do not use more than 3 weeks in a row.   Recommend patch testing next - no steroids for 2 weeks prior.  Poison ivy Take prednisone taper as below.  Prednisone 10mg  tablet pack: 2 tablets given in office today. Take 2 more tablets before bed today.  Then take 2 tablets twice a day for 2 more days. Then take 2 tablets once a day for 1 day. Then take 1 tablet once a day for 1 day.   Use triamcinolone 0.1% ointment twice a day as needed for eczema flares. Do not use on the face, neck, armpits or groin area. Do not use more than 3 weeks in a row.   Follow up for patch testing. Patches are best placed on Monday with return to office on Wednesday and Friday of same week for readings.  Patches once placed should not get wet.  You do not have to stop any medications for patch testing but should not be on oral prednisone. You can schedule a patch testing visit when convenient for your schedule.    True Test looks for the following sensitivities:         Skin care recommendations  Bath time: . Always use lukewarm water. AVOID very hot or cold water. Friday Keep bathing time to 5-10 minutes. . Do NOT use bubble bath. . Use a mild soap and use just enough to wash the dirty areas. . Do NOT scrub skin vigorously.  . After bathing, pat dry  your skin with a towel. Do NOT rub or scrub the skin.  Moisturizers and prescriptions:  . ALWAYS apply moisturizers immediately after bathing (within 3 minutes). This helps to lock-in moisture. . Use the moisturizer several times a day over the whole body. Marland Kitchen summer moisturizers include: Aveeno, CeraVe, Cetaphil. Peri Jefferson winter moisturizers include: Aquaphor, Vaseline, Cerave, Cetaphil, Eucerin, Vanicream. . When using moisturizers along with medications, the moisturizer should be applied about one hour after applying the medication to prevent diluting effect of the medication or moisturize around where you applied the medications. When not using medications, the moisturizer can be continued twice daily as maintenance.  Laundry and clothing: . Avoid laundry products with added color or perfumes. . Use unscented hypo-allergenic laundry products such as Tide free, Cheer free & gentle, and All free and clear.  . If the skin still seems dry or sensitive, you can try double-rinsing the clothes. . Avoid tight or scratchy clothing such as wool. . Do not use fabric softeners or dyer sheets.

## 2020-12-25 NOTE — Assessment & Plan Note (Addendum)
Pruritic, erythematous rash mainly on the hands and neck which started 4-5 years ago. Rash waxes and wanes. 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  Given patient's occupation and distribution of the rash concerning for contact dermatitis from chemicals he is exposed to at work.   See below for proper skin care - use fragrance free and dye free products.   Try to wear a cotton glove underneath a regular glove when doing nail work.  Wash hands frequently and moisturize throughout the day.   Use Eucrisa (crisaborole) 2% ointment twice a day on mild eczema flares on the face and body. This is a non-steroid ointment. Samples given.   Use triamcinolone 0.1% ointment twice a day as needed for eczema flares. Do not use on the face, neck, armpits or groin area. Do not use more than 3 weeks in a row.   Recommend patch testing next.  Gave handout on Dupixent.

## 2020-12-25 NOTE — Assessment & Plan Note (Signed)
Poison ivy dermatitis on the forearms. Had a steroid injection with some benefit. Take prednisone taper.  Use triamcinolone 0.1% ointment twice a day as needed for eczema flares. Do not use on the face, neck, armpits or groin area. Do not use more than 3 weeks in a row.

## 2020-12-25 NOTE — Telephone Encounter (Signed)
Can you have patient schedule the patch test the week of 6/13 or later?  He is supposed to be off prednisone for 2 weeks and I just gave a him a 5 day course of prednisone so if he comes on 6/6 it would be too soon.  Thank you.

## 2020-12-25 NOTE — Progress Notes (Signed)
New Patient Note  RE: Jeffrey King MRN: [id number] DOB: 1981-01-13 Date of Office Visit: 12/25/2020  Consult requested by: Sherrilyn Rist, MD Primary care provider: Alysia Penna, MD  Chief Complaint: Eczema (Says he has pictures of his skin. Has dealt with eczema for the last 7 years. Says it comes and goes. The colder weather he notices the most flare ups. Has no current treatment plan. Also complains of a possible corn on his Left index finger that he has had for the past 10 or so years. Also has a current poison ivy. )  History of Present Illness: I had the pleasure of seeing [name] [facility]. He is a 40 y.o. male, who is referred here by Dr. Myrtie Neither (GI) for the evaluation of rash.   Rash:  Rash started about 4-5 years ago. Mainly occurs on his hands, neck. Describes them as red, itchy, raised. Suspected triggers are unknown. Denies any fevers, chills, changes in medications, foods, personal care products or recent infections. He has tried the following therapies: none.  Previous work up includes: none. Previous history of rash/hives: no.  Currently using [place] body wash, using scented laundry detergent, Cetaphil lotion. 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 He tried to wear gloves but it made the rash worse so he stopped. He is exposed to a lot of acrylic dust and other chemicals.  He is also recovering from a current poison ivy rash on the forearms. Had a steroid injection last Thursday in the urgent care which helped but still having rash. He did wash all the clothing that he was wearing while came in contact with the poison ivy.  Assessment and Plan: Dahmir is a 40 y.o. male with: Allergic contact dermatitis Pruritic, erythematous rash mainly on the hands and neck which started 4-5 years ago. Rash waxes and wanes. 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  Given patient's occupation  and distribution of the rash concerning for contact dermatitis from chemicals he is exposed to at work.   See below for proper skin care - use fragrance free and dye free products.   Try to wear a cotton glove underneath a regular glove when doing nail work.  Wash hands frequently and moisturize throughout the day.   Use Eucrisa (crisaborole) 2% ointment twice a day on mild eczema flares on the face and body. This is a non-steroid ointment. Samples given.   Use triamcinolone 0.1% ointment twice a day as needed for eczema flares. Do not use on the face, neck, armpits or groin area. Do not use more than 3 weeks in a row.   Recommend patch testing next.  Gave handout on Dupixent.  Poison ivy dermatitis Poison ivy dermatitis on the forearms. Had a steroid injection with some benefit. Take prednisone taper.  Use triamcinolone 0.1% ointment twice a day as needed for eczema flares. Do not use on the face, neck, armpits or groin area. Do not use more than 3 weeks in a row.   Return for Patch testing.  Meds ordered this encounter  Medications  . triamcinolone ointment (KENALOG) 0.1 %    Sig: Apply 1 application topically 2 (two) times daily as needed. For poison ivy and moderate rash flares on the hands twice a day. Do not use on the face, neck, armpits or groin area. Do not use more than 3 weeks in a row.  Dispense:  80 g    Refill:  2  . Crisaborole (EUCRISA) 2 % OINT    Sig: Use twice a day on mild rash flare.    Dispense:  100 g    Refill:  2   Lab Orders  No laboratory test(s) ordered today    Other allergy screening: Asthma: no Rhino conjunctivitis: no Food allergy: no Medication allergy: no Hymenoptera allergy: no History of recurrent infections suggestive of immunodeficency: no  Diagnostics: None.  Past Medical History: Patient Active Problem List   Diagnosis Date Noted  . Allergic contact dermatitis 12/25/2020  . Poison ivy dermatitis 12/25/2020  . Viral warts  12/25/2020  . Allergic rhinitis 01/31/2014   Past Medical History:  Diagnosis Date  . Allergic rhinitis   . Allergy   . Eczema   . GERD (gastroesophageal reflux disease)   . Scoliosis    mild  . Viral wart    Past Surgical History: Past Surgical History:  Procedure Laterality Date  . LASIK    . TYMPANOSTOMY TUBE PLACEMENT     Medication List:  Current Outpatient Medications  Medication Sig Dispense Refill  . Crisaborole (EUCRISA) 2 % OINT Use twice a day on mild rash flare. 100 g 2  . triamcinolone ointment (KENALOG) 0.1 % Apply 1 application topically 2 (two) times daily as needed. For poison ivy and moderate rash flares on the hands twice a day. Do not use on the face, neck, armpits or groin area. Do not use more than 3 weeks in a row. 80 g 2   No current facility-administered medications for this visit.   Allergies: No Known Allergies Social History: Social History   Socioeconomic History  . Marital status: Single    Spouse name: n/a  . Number of children: 3  . Years of education: 50  . Highest education level: Not on file  Occupational History  . Occupation: nail tech    Comment: [employer]  Tobacco Use  . Smoking status: Light Tobacco Smoker  . Smokeless tobacco: Never Used  Substance and Sexual Activity  . Alcohol use: Yes    Comment: rare socially  . Drug use: No  . Sexual activity: Yes  Other Topics Concern  . Not on file  Social History Narrative   Lives with his 3 children. His family lives in [place].   Social Determinants of Health   Financial Resource Strain: Not on file  Food Insecurity: Not on file  Transportation Needs: Not on file  Physical Activity: Not on file  Stress: Not on file  Social Connections: Not on file   Lives in a 40 year old house. Smoking: yes - 3 to 4 cigs per day. Occupation: Financial trader HistorySurveyor, minerals in the house: no Carpet in the family room: no Carpet in the bedroom: no Heating:  electric Cooling: central Pet: no  Family History: Family History  Problem Relation Age of Onset  . Hypertension Father   . Colon cancer Neg Hx   . Esophageal cancer Neg Hx   . Rectal cancer Neg Hx   . Stomach cancer Neg Hx    Problem                               Relation Asthma  No  Eczema                                Mother   Food allergy                          No  Allergic rhino conjunctivitis     No   Review of Systems  Constitutional: Negative for appetite change, chills, fever and unexpected weight change.  HENT: Negative for congestion and rhinorrhea.   Eyes: Negative for itching.  Respiratory: Negative for cough, chest tightness, shortness of breath and wheezing.   Cardiovascular: Negative for chest pain.  Gastrointestinal: Negative for abdominal pain.  Genitourinary: Negative for difficulty urinating.  Skin: Positive for rash.  Neurological: Negative for headaches.   Objective: BP 128/80   Pulse 71   Temp 98.2 F (36.8 C)   Resp 20   Ht 6' (1.829 m)   Wt 173 lb 9.6 oz (78.7 kg)   SpO2 98%   BMI 23.54 kg/m  Body mass index is 23.54 kg/m. Physical Exam Vitals and nursing note reviewed.  Constitutional:      Appearance: Normal appearance. He is well-developed.  HENT:     Head: Normocephalic and atraumatic.     Right Ear: Tympanic membrane and external ear normal.     Left Ear: Tympanic membrane and external ear normal.     Nose: Nose normal.     Mouth/Throat:     Mouth: Mucous membranes are moist.     Pharynx: Oropharynx is clear.  Eyes:     Conjunctiva/sclera: Conjunctivae normal.  Cardiovascular:     Rate and Rhythm: Normal rate and regular rhythm.     Heart sounds: Normal heart sounds. No murmur heard. No friction rub. No gallop.   Pulmonary:     Effort: Pulmonary effort is normal.     Breath sounds: Normal breath sounds. No wheezing, rhonchi or rales.  Musculoskeletal:     Cervical back: Neck supple.   Skin:    General: Skin is warm.     Findings: Rash present.     Comments: Leathery, erythematous patches on right and left dorsal surface of the hands b/l. Scattered papular erythematous rash on forearms b/l.  Neurological:     Mental Status: He is alert and oriented to person, place, and time.  Psychiatric:        Behavior: Behavior normal.    The plan was reviewed with the patient/family, and all questions/concerned were addressed.  It was my pleasure to see Jourdan today and participate in his care. Please feel free to contact me with any questions or concerns.  Sincerely,  Wyline Mood, DO Allergy & Immunology  [facility] office: [phone] [facility] office: [phone]

## 2020-12-25 NOTE — Telephone Encounter (Signed)
Patient is moved to the week on 01/22/2021.  Thanks

## 2021-01-08 ENCOUNTER — Ambulatory Visit: Payer: 59 | Admitting: Family

## 2021-01-10 ENCOUNTER — Encounter: Payer: 59 | Admitting: Family Medicine

## 2021-01-12 ENCOUNTER — Encounter: Payer: 59 | Admitting: Family Medicine

## 2021-01-22 ENCOUNTER — Ambulatory Visit (INDEPENDENT_AMBULATORY_CARE_PROVIDER_SITE_OTHER): Payer: 59 | Admitting: Allergy

## 2021-01-22 ENCOUNTER — Other Ambulatory Visit: Payer: Self-pay

## 2021-01-22 ENCOUNTER — Encounter: Payer: Self-pay | Admitting: Allergy

## 2021-01-22 VITALS — BP 102/76 | HR 71 | Temp 97.4°F | Resp 18 | Ht 72.0 in | Wt 174.0 lb

## 2021-01-22 DIAGNOSIS — L239 Allergic contact dermatitis, unspecified cause: Secondary | ICD-10-CM | POA: Diagnosis not present

## 2021-01-22 NOTE — Patient Instructions (Addendum)
Patches placed today. Please avoid strenuous physical activities and do not get the patches on the back wet. No showering until final patch reading done. Okay to take antihistamines (such as zyrtec 10mg  daily) for itching but avoid placing any creams on the back where the patches are. We will remove the patches on Wednesday and will do our initial read. Then you will come back on Friday for a final read.

## 2021-01-22 NOTE — Assessment & Plan Note (Addendum)
Past history - Pruritic, erythematous rash mainly on the hands and neck which started 4-5 years ago. Rash waxes and wanes. 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  TRUE patches placed today.  Use over the counter antihistamines such as Zyrtec (cetirizine), Claritin (loratadine), Allegra (fexofenadine), or Xyzal (levocetirizine) daily as needed for the itching.

## 2021-01-22 NOTE — Progress Notes (Signed)
   Follow Up Note  RE: Angle [name] MRN: [id number] DOB: October 20, 1980 Date of Office Visit: 01/22/2021  Referring provider: Alysia Penna, MD Primary care provider: Alysia Penna, MD  History of Present Illness: I had the pleasure of seeing Jeffrey King for a follow up visit at the [facility] on 01/22/2021. He is a 40 y.o. male, who is being followed for dermatitis. Today he is here for patch test placement, given suspected history of contact dermatitis.   Allergic contact dermatitis Using triamcinolone as needed with good benefit. Last few days noted some itching on the scalp and neck but no rash.  Diagnostics: TRUE Test patches placed.   Assessment and Plan: [name] is a 40 y.o. male with: Allergic contact dermatitis Past history - Pruritic, erythematous rash mainly on the hands and neck which started 4-5 years ago. Rash waxes and wanes. 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 TRUE patches placed today. Use over the counter antihistamines such as Zyrtec (cetirizine), Claritin (loratadine), Allegra (fexofenadine), or Xyzal (levocetirizine) daily as needed for the itching.  The patient was instructed regarding proper care of the patches for the next 48 hours. Do not get patches wet - avoid showering until the next visit. Do not engage in vigorous physical activity. Patient will follow up in 48 hours and 96 hours for patch readings.  It was my pleasure to see [name] today and participate in his care. Please feel free to contact me with any questions or concerns.  Sincerely,  Wyline Mood, DO Allergy & Immunology  [facility] office: [phone] [facility] office: [phone] [place] office: [phone]

## 2021-01-24 ENCOUNTER — Other Ambulatory Visit: Payer: Self-pay

## 2021-01-24 ENCOUNTER — Ambulatory Visit: Payer: 59 | Admitting: Family Medicine

## 2021-01-24 ENCOUNTER — Encounter: Payer: Self-pay | Admitting: Family Medicine

## 2021-01-24 DIAGNOSIS — L239 Allergic contact dermatitis, unspecified cause: Secondary | ICD-10-CM

## 2021-01-24 NOTE — Progress Notes (Signed)
    Follow-up Note  RE: Jeffrey King MRN: [id number] DOB: 1981-04-10 Date of Office Visit: 01/24/2021  Primary care provider: Alysia Penna, MD Referring provider: Alysia Penna, MD   [name] returns to the office today for the initial patch test interpretation, given suspected history of contact dermatitis.    Diagnostics:   TRUE TEST 48-hour hour reading:  possible reaction to  #6 (Fragrance mix)   Plan:   Allergic contact dermatitis - The patient has been provided detailed information regarding the substances he is sensitive to, as well as products containing the substances.   - Meticulous avoidance of these substances is recommended.  - If avoidance is not possible, the use of barrier creams or lotions is recommended. - If symptoms persist or progress despite meticulous avoidance of fragrance mix, a dermatology referral may be warranted.  Call the clinic if this treatment plan is not working well for you  Follow up in 2 days or sooner if needed.  Thank you for the opportunity to care for this patient.  Please do not hesitate to contact me with questions.  Thermon Leyland, FNP [facility]

## 2021-01-24 NOTE — Patient Instructions (Signed)
TRUE TEST 48-hour hour reading: possible reaction to  #6 (Fragrance mix)   Plan:   Allergic contact dermatitis - The patient has been provided detailed information regarding the substances he is sensitive to, as well as products containing the substances.   - Meticulous avoidance of these substances is recommended.  - If avoidance is not possible, the use of barrier creams or lotions is recommended. - If symptoms persist or progress despite meticulous avoidance of fragrance mix, a dermatology referral may be warranted.  Call the clinic if this treatment plan is not working well for you  Follow up in 2 days or sooner if needed.

## 2021-01-26 ENCOUNTER — Other Ambulatory Visit: Payer: Self-pay

## 2021-01-26 ENCOUNTER — Encounter: Payer: Self-pay | Admitting: Allergy

## 2021-01-26 ENCOUNTER — Ambulatory Visit: Payer: 59 | Admitting: Allergy

## 2021-01-26 DIAGNOSIS — L235 Allergic contact dermatitis due to other chemical products: Secondary | ICD-10-CM

## 2021-01-26 NOTE — Patient Instructions (Signed)
True Test looks for the following sensitivities:

## 2021-01-26 NOTE — Progress Notes (Signed)
    Follow-up Note  RE: Jeffrey King MRN: [id number] DOB: 1980-09-13 Date of Office Visit: 01/26/2021  Primary care provider: Alysia Penna, MD Referring provider: Alysia Penna, MD   Yahel returns to the office today for the initial patch test interpretation, given suspected history of contact dermatitis.    Diagnostics:  TRUE TEST 96 hour reading: 1+ to formaldehyde, gold sodium thiosulfate and diazolidinyl urea.   48 hour reading: 1+ to fragrance mix  Plan:  Allergic contact dermatitis The patient has been provided detailed information regarding the substances she is sensitive to, as well as products containing the substances.  Meticulous avoidance of these substances is recommended. If avoidance is not possible, the use of barrier creams or lotions is recommended. If symptoms persist or progress despite meticulous avoidance of the above products, dermatology evaluation may be warranted. Will email him ([email or web address]) the contact dermatitis product safe list to his email.  Margo Aye, MD [facility]

## 2022-04-19 ENCOUNTER — Emergency Department (HOSPITAL_COMMUNITY): Payer: Commercial Managed Care - HMO

## 2022-04-19 ENCOUNTER — Emergency Department (HOSPITAL_COMMUNITY)
Admission: EM | Admit: 2022-04-19 | Discharge: 2022-04-20 | Disposition: A | Discharge status: Home / Self Care | Payer: Commercial Managed Care - HMO | Attending: Emergency Medicine | Admitting: Emergency Medicine

## 2022-04-19 ENCOUNTER — Other Ambulatory Visit: Payer: Self-pay

## 2022-04-19 ENCOUNTER — Encounter (HOSPITAL_COMMUNITY): Payer: Self-pay

## 2022-04-19 DIAGNOSIS — R0789 Other chest pain: Secondary | ICD-10-CM | POA: Insufficient documentation

## 2022-04-19 LAB — CBC
HCT: 47.9 % (ref 39.0–52.0)
Hemoglobin: 15.6 g/dL (ref 13.0–17.0)
MCH: 29.1 pg (ref 26.0–34.0)
MCHC: 32.6 g/dL (ref 30.0–36.0)
MCV: 89.4 fL (ref 80.0–100.0)
Platelets: 195 10*3/uL (ref 150–400)
RBC: 5.36 MIL/uL (ref 4.22–5.81)
RDW: 12 % (ref 11.5–15.5)
WBC: 6.4 10*3/uL (ref 4.0–10.5)
nRBC: 0 % (ref 0.0–0.2)

## 2022-04-19 LAB — BASIC METABOLIC PANEL
Anion gap: 4 — ABNORMAL LOW (ref 5–15)
BUN: 21 mg/dL — ABNORMAL HIGH (ref 6–20)
CO2: 29 mmol/L (ref 22–32)
Calcium: 9.3 mg/dL (ref 8.9–10.3)
Chloride: 106 mmol/L (ref 98–111)
Creatinine, Ser: 0.8 mg/dL (ref 0.61–1.24)
GFR, Estimated: >60 mL/min (ref >60)
Glucose, Bld: 99 mg/dL (ref 70–99)
Potassium: 4.4 mmol/L (ref 3.5–5.1)
Sodium: 139 mmol/L (ref 135–145)

## 2022-04-19 LAB — TROPONIN I (HIGH SENSITIVITY): Troponin I (High Sensitivity): 2 ng/L (ref <18)

## 2022-04-19 NOTE — ED Triage Notes (Signed)
Patient c/o intermittent left chest pain x 1 week and worse today . Patient c/o SOB that started today.

## 2022-04-19 NOTE — ED Provider Triage Note (Signed)
Emergency Medicine Provider Triage Evaluation Note  Jeffrey King , a 41 y.o. male  was evaluated in triage.  Pt complains of left-sided chest pain going on for about 1.5 weeks with associated shortness of breath.  Denies palpitations, lightheadedness.   Review of Systems  Positive: As above Negative: As above  Physical Exam  BP 135/79 (BP Location: Left Arm)   Pulse 73   Temp 98.2 F (36.8 C)   Resp 14   Ht 6' (1.829 m)   Wt 76.2 kg   SpO2 100%   BMI 22.78 kg/m  Gen:   Awake, no distress   Resp:  Normal effort  MSK:   Moves extremities without difficulty  Other:    Medical Decision Making  Medically screening exam initiated at 6:41 PM.  Appropriate orders placed.  Aviv Lengacher was informed that the remainder of the evaluation will be completed by another provider, this initial triage assessment does not replace that evaluation, and the importance of remaining in the ED until their evaluation is complete.    Marita Kansas, PA-C 04/19/22 1842

## 2022-04-19 NOTE — ED Notes (Signed)
Pt complaining of intermittent chest pains feeling like needle poking him. Vital signs rechecked

## 2022-04-20 MED ORDER — METHOCARBAMOL 500 MG PO TABS: 500.0000 mg | ORAL_TABLET | Freq: Two times a day (BID) | ORAL | 0 refills | Status: AC | PRN

## 2022-04-20 MED ORDER — DICLOFENAC SODIUM 50 MG PO TBEC: 50.0000 mg | DELAYED_RELEASE_TABLET | Freq: Two times a day (BID) | ORAL | 0 refills | Status: AC

## 2022-04-20 NOTE — ED Provider Notes (Signed)
[facility]-EMERGENCY DEPT Provider Note   CSN: [id number] Arrival date & time: 04/19/22  1805     History  Chief Complaint  Patient presents with   Chest Pain    Jeffrey King is a 41 y.o. male.  41 year old male presents to the ED for evaluation of chest pain. Pain has been constant x 1.5 weeks in the left chest, nonradiating. Pain improved slightly when upright. Patient has tried OTC NSAIDs sporadically w/o relief. He works doing nails. No fever, leg swelling, hemoptysis, prolonged travel, recent surgeries. Denies FHx of ACS or sudden cardiac death. Denies illicit drug use and cocaine use.   The history is provided by the patient. No language interpreter was used.  Chest Pain      Home Medications Prior to Admission medications   Medication Sig Start Date End Date Taking? Authorizing Provider  diclofenac (VOLTAREN) 50 MG EC tablet Take 1 tablet (50 mg total) by mouth 2 (two) times daily with a meal for 10 days. 04/20/22 04/30/22 Yes Antony Madura, PA-C  methocarbamol (ROBAXIN) 500 MG tablet Take 1 tablet (500 mg total) by mouth every 12 (twelve) hours as needed for muscle spasms. 04/20/22  Yes Antony Madura, PA-C  Crisaborole (EUCRISA) 2 % OINT Use twice a day on mild rash flare. 12/25/20   Ellamae Sia, DO  triamcinolone ointment (KENALOG) 0.1 % Apply 1 application topically 2 (two) times daily as needed. For poison ivy and moderate rash flares on the hands twice a day. Do not use on the face, neck, armpits or groin area. Do not use more than 3 weeks in a row. 12/25/20   Ellamae Sia, DO      Allergies    Patient has no known allergies.    Review of Systems   Review of Systems  Cardiovascular:  Positive for chest pain.  Ten systems reviewed and are negative for acute change, except as noted in the HPI.    Physical Exam Updated Vital Signs BP 112/79   Pulse 70   Temp 98.3 F (36.8 C) (Oral)   Resp 12   Ht 6' (1.829 m)   Wt 76.2 kg   SpO2 99%   BMI 22.78  kg/m   Physical Exam Vitals and nursing note reviewed.  Constitutional:      General: He is not in acute distress.    Appearance: He is well-developed. He is not diaphoretic.     Comments: Nontoxic appearing and in NAD  HENT:     Head: Normocephalic and atraumatic.  Eyes:     General: No scleral icterus.    Conjunctiva/sclera: Conjunctivae normal.  Pulmonary:     Effort: Pulmonary effort is normal. No respiratory distress.     Breath sounds: No stridor. No wheezing.     Comments: TTP to the left chest wall, lateral to the lower sternal border and inferior to the left pectoral muscle. No crepitus or deformity. Respirations even and unlabored.  Chest:     Chest wall: Tenderness present.    Musculoskeletal:        General: Normal range of motion.     Cervical back: Normal range of motion.     Right lower leg: No edema.     Left lower leg: No edema.  Skin:    General: Skin is warm and dry.     Coloration: Skin is not pale.     Findings: No erythema or rash.  Neurological:     Mental Status:  He is alert and oriented to person, place, and time.     Coordination: Coordination normal.  Psychiatric:        Behavior: Behavior normal.     ED Results / Procedures / Treatments   Labs (all labs ordered are listed, but only abnormal results are displayed) Labs Reviewed  BASIC METABOLIC PANEL - Abnormal; Notable for the following components:      Result Value   BUN 21 (*)    Anion gap 4 (*)    All other components within normal limits  CBC  TROPONIN I (HIGH SENSITIVITY)    EKG EKG Interpretation  Date/Time:  Friday April 19 2022 18:23:03 EDT Ventricular Rate:  75 PR Interval:  150 QRS Duration: 99 QT Interval:  376 QTC Calculation: 420 R Axis:   92 Text Interpretation: Sinus rhythm Consider right ventricular hypertrophy ST elev, probable normal early repol pattern No prior tracing for comparison Confirmed by Nanda Quinton [phone]) on 04/20/2022 1:08:53  AM  Radiology DG Chest 2 View  Result Date: 04/19/2022 CLINICAL DATA:  chest pain EXAM: CHEST - 2 VIEW COMPARISON:  None Available. FINDINGS: The heart and mediastinal contours are within normal limits. Hazy airspace opacity on lateral view along the lower lobes likely due to positioning. No definite focal consolidation. No pulmonary edema. No pleural effusion. No pneumothorax. No acute osseous abnormality. Dextroscoliosis of the thoracic spine. IMPRESSION: No active cardiopulmonary disease. Electronically Signed   By: Iven Finn M.D.   On: 04/19/2022 18:43    Procedures Procedures    Medications Ordered in ED Medications - No data to display  ED Course/ Medical Decision Making/ A&P                           Medical Decision Making Amount and/or Complexity of Data Reviewed Labs: ordered. Radiology: ordered.  Risk Prescription drug management.   This patient presents to the ED for concern of chest pain, this involves an extensive number of treatment options, and is a complaint that carries with it a high risk of complications and morbidity.  The differential diagnosis includes ACS vs MSK vs PTX vs PNA vs PE   Co morbidities that complicate the patient evaluation  GERD Scoliosis    Additional history obtained:  Additional history obtained from partner at bedside   Lab Tests:  I Ordered, and personally interpreted labs.  The pertinent results include:  normal CBC, BMP, troponin   Imaging Studies ordered:  I ordered imaging studies including CXR  I independently visualized and interpreted imaging which showed no acute cardiopulmonary abnormality I agree with the radiologist interpretation   Cardiac Monitoring:  The patient was maintained on a cardiac monitor.  I personally viewed and interpreted the cardiac monitored which showed an underlying rhythm of: NSR   Medicines ordered and prescription drug management:  I have reviewed the patients home medicines  and have made adjustments as needed   Test Considered:  Repeat Troponin   Problem List / ED Course:  Low suspicion for emergent cardiac etiology given reassuring workup today.   EKG is nonischemic and troponin negative.   Chest x-ray without evidence of mediastinal widening to suggest dissection.  No pneumothorax, pneumonia, pleural effusion.  Pulmonary embolus further considered; however, patient without tachycardia, tachypnea, dyspnea, hypoxia.  Patient is PERC negative.   Reevaluation:  After the interventions noted above, I reevaluated the patient and found that they have :stayed the same   Social Determinants of  Health:  Insured patient   Dispostion:  After consideration of the diagnostic results and the patients response to treatment, I feel that the patent would benefit from initiation of NSAID course with close PCP f/u. Return precautions discussed and provided. Patient discharged in stable condition with no unaddressed concerns.          Final Clinical Impression(s) / ED Diagnoses Final diagnoses:  Chest wall pain    Rx / DC Orders ED Discharge Orders          Ordered    diclofenac (VOLTAREN) 50 MG EC tablet  2 times daily with meals        04/20/22 0104    methocarbamol (ROBAXIN) 500 MG tablet  Every 12 hours PRN        04/20/22 0104              Antony Madura, PA-C 04/20/22 7829    Long, Arlyss Repress, MD 04/25/22 1622

## 2022-04-20 NOTE — Discharge Instructions (Addendum)
Your evaluation in the ED today was reassuring.  We recommend the use of Voltaren as prescribed for management of ongoing symptoms.  Try to avoid strenuous activity and heavy lifting.  You may also use over-the-counter Lidoderm patches if discomfort persists.  Follow-up with your primary care doctor to ensure resolution of symptoms.  Return to the ED if symptoms persist or worsen.

## 2022-09-10 DIAGNOSIS — M79642 Pain in left hand: Secondary | ICD-10-CM | POA: Diagnosis not present

## 2022-09-10 DIAGNOSIS — G8929 Other chronic pain: Secondary | ICD-10-CM | POA: Diagnosis not present

## 2022-09-10 DIAGNOSIS — M25512 Pain in left shoulder: Secondary | ICD-10-CM | POA: Diagnosis not present

## 2022-09-10 DIAGNOSIS — G5603 Carpal tunnel syndrome, bilateral upper limbs: Secondary | ICD-10-CM | POA: Diagnosis not present

## 2022-09-10 DIAGNOSIS — M79641 Pain in right hand: Secondary | ICD-10-CM | POA: Diagnosis not present

## 2023-03-26 DIAGNOSIS — R7989 Other specified abnormal findings of blood chemistry: Secondary | ICD-10-CM | POA: Diagnosis not present

## 2023-03-26 DIAGNOSIS — Z125 Encounter for screening for malignant neoplasm of prostate: Secondary | ICD-10-CM | POA: Diagnosis not present

## 2023-04-02 DIAGNOSIS — Z1331 Encounter for screening for depression: Secondary | ICD-10-CM | POA: Diagnosis not present

## 2023-04-02 DIAGNOSIS — Z1339 Encounter for screening examination for other mental health and behavioral disorders: Secondary | ICD-10-CM | POA: Diagnosis not present

## 2023-04-02 DIAGNOSIS — K2100 Gastro-esophageal reflux disease with esophagitis, without bleeding: Secondary | ICD-10-CM | POA: Diagnosis not present

## 2023-04-02 DIAGNOSIS — Z0000 Encounter for general adult medical examination without abnormal findings: Secondary | ICD-10-CM | POA: Diagnosis not present

## 2023-04-02 DIAGNOSIS — R7989 Other specified abnormal findings of blood chemistry: Secondary | ICD-10-CM | POA: Diagnosis not present

## 2023-04-02 DIAGNOSIS — L308 Other specified dermatitis: Secondary | ICD-10-CM | POA: Diagnosis not present

## 2023-04-03 DIAGNOSIS — R109 Unspecified abdominal pain: Secondary | ICD-10-CM | POA: Diagnosis not present

## 2024-04-12 DIAGNOSIS — Z125 Encounter for screening for malignant neoplasm of prostate: Secondary | ICD-10-CM | POA: Diagnosis not present

## 2024-04-12 DIAGNOSIS — Z1389 Encounter for screening for other disorder: Secondary | ICD-10-CM | POA: Diagnosis not present

## 2024-04-13 DIAGNOSIS — K2100 Gastro-esophageal reflux disease with esophagitis, without bleeding: Secondary | ICD-10-CM | POA: Diagnosis not present

## 2024-04-19 DIAGNOSIS — Z1331 Encounter for screening for depression: Secondary | ICD-10-CM | POA: Diagnosis not present

## 2024-04-19 DIAGNOSIS — L308 Other specified dermatitis: Secondary | ICD-10-CM | POA: Diagnosis not present

## 2024-04-19 DIAGNOSIS — K2100 Gastro-esophageal reflux disease with esophagitis, without bleeding: Secondary | ICD-10-CM | POA: Diagnosis not present

## 2024-04-19 DIAGNOSIS — Z0000 Encounter for general adult medical examination without abnormal findings: Secondary | ICD-10-CM | POA: Diagnosis not present

## 2024-04-19 DIAGNOSIS — Z1339 Encounter for screening examination for other mental health and behavioral disorders: Secondary | ICD-10-CM | POA: Diagnosis not present
